# Patient Record
Sex: Female | Born: 1975
Health system: Southern US, Community
[De-identification: ages and names within clinical notes are randomized; demographics above are authoritative.]

## PROBLEM LIST (undated history)

## (undated) DIAGNOSIS — C801 Malignant (primary) neoplasm, unspecified: Secondary | ICD-10-CM

## (undated) DIAGNOSIS — N83209 Unspecified ovarian cyst, unspecified side: Secondary | ICD-10-CM

## (undated) DIAGNOSIS — R102 Pelvic and perineal pain unspecified side: Secondary | ICD-10-CM

## (undated) DIAGNOSIS — R519 Headache, unspecified: Secondary | ICD-10-CM

## (undated) DIAGNOSIS — E119 Type 2 diabetes mellitus without complications: Secondary | ICD-10-CM

## (undated) DIAGNOSIS — N200 Calculus of kidney: Secondary | ICD-10-CM

## (undated) DIAGNOSIS — K660 Peritoneal adhesions (postprocedural) (postinfection): Secondary | ICD-10-CM

## (undated) DIAGNOSIS — I10 Essential (primary) hypertension: Secondary | ICD-10-CM

## (undated) DIAGNOSIS — G4730 Sleep apnea, unspecified: Secondary | ICD-10-CM

## (undated) HISTORY — DX: Sleep apnea, unspecified: G47.30

## (undated) HISTORY — DX: Pelvic and perineal pain: R10.2

## (undated) HISTORY — DX: Calculus of kidney: N20.0

## (undated) HISTORY — DX: Essential (primary) hypertension: I10

## (undated) HISTORY — DX: Pelvic and perineal pain unspecified side: R10.20

## (undated) HISTORY — DX: Peritoneal adhesions (postprocedural) (postinfection): K66.0

---
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
## 1999-08-17 HISTORY — PX: CHOLECYSTECTOMY: SHX55

## 2005-03-16 ENCOUNTER — Ambulatory Visit: Payer: Self-pay

## 2005-03-23 ENCOUNTER — Ambulatory Visit: Payer: Self-pay | Admitting: Unknown Physician Specialty

## 2005-05-05 ENCOUNTER — Observation Stay: Payer: Self-pay

## 2005-05-21 ENCOUNTER — Observation Stay: Payer: Self-pay | Admitting: Obstetrics & Gynecology

## 2005-05-24 ENCOUNTER — Inpatient Hospital Stay: Payer: Self-pay

## 2008-06-24 ENCOUNTER — Ambulatory Visit: Payer: Self-pay | Admitting: Urology

## 2008-11-22 ENCOUNTER — Ambulatory Visit: Payer: Self-pay | Admitting: Urology

## 2008-12-06 ENCOUNTER — Ambulatory Visit: Payer: Self-pay | Admitting: Urology

## 2008-12-20 ENCOUNTER — Ambulatory Visit: Payer: Self-pay | Admitting: Urology

## 2009-02-18 ENCOUNTER — Ambulatory Visit: Payer: Self-pay | Admitting: Urology

## 2009-03-05 ENCOUNTER — Ambulatory Visit: Payer: Self-pay | Admitting: Urology

## 2009-03-18 ENCOUNTER — Ambulatory Visit: Payer: Self-pay | Admitting: Urology

## 2009-10-06 ENCOUNTER — Ambulatory Visit: Payer: Self-pay | Admitting: Urology

## 2009-11-19 IMAGING — CR DG ABDOMEN 1V
1 series · 1 of 1 positions shown · non-contrast
Comparison: none

REASON FOR EXAM: Nephrolithiasis
COMMENTS:

[view not recorded]
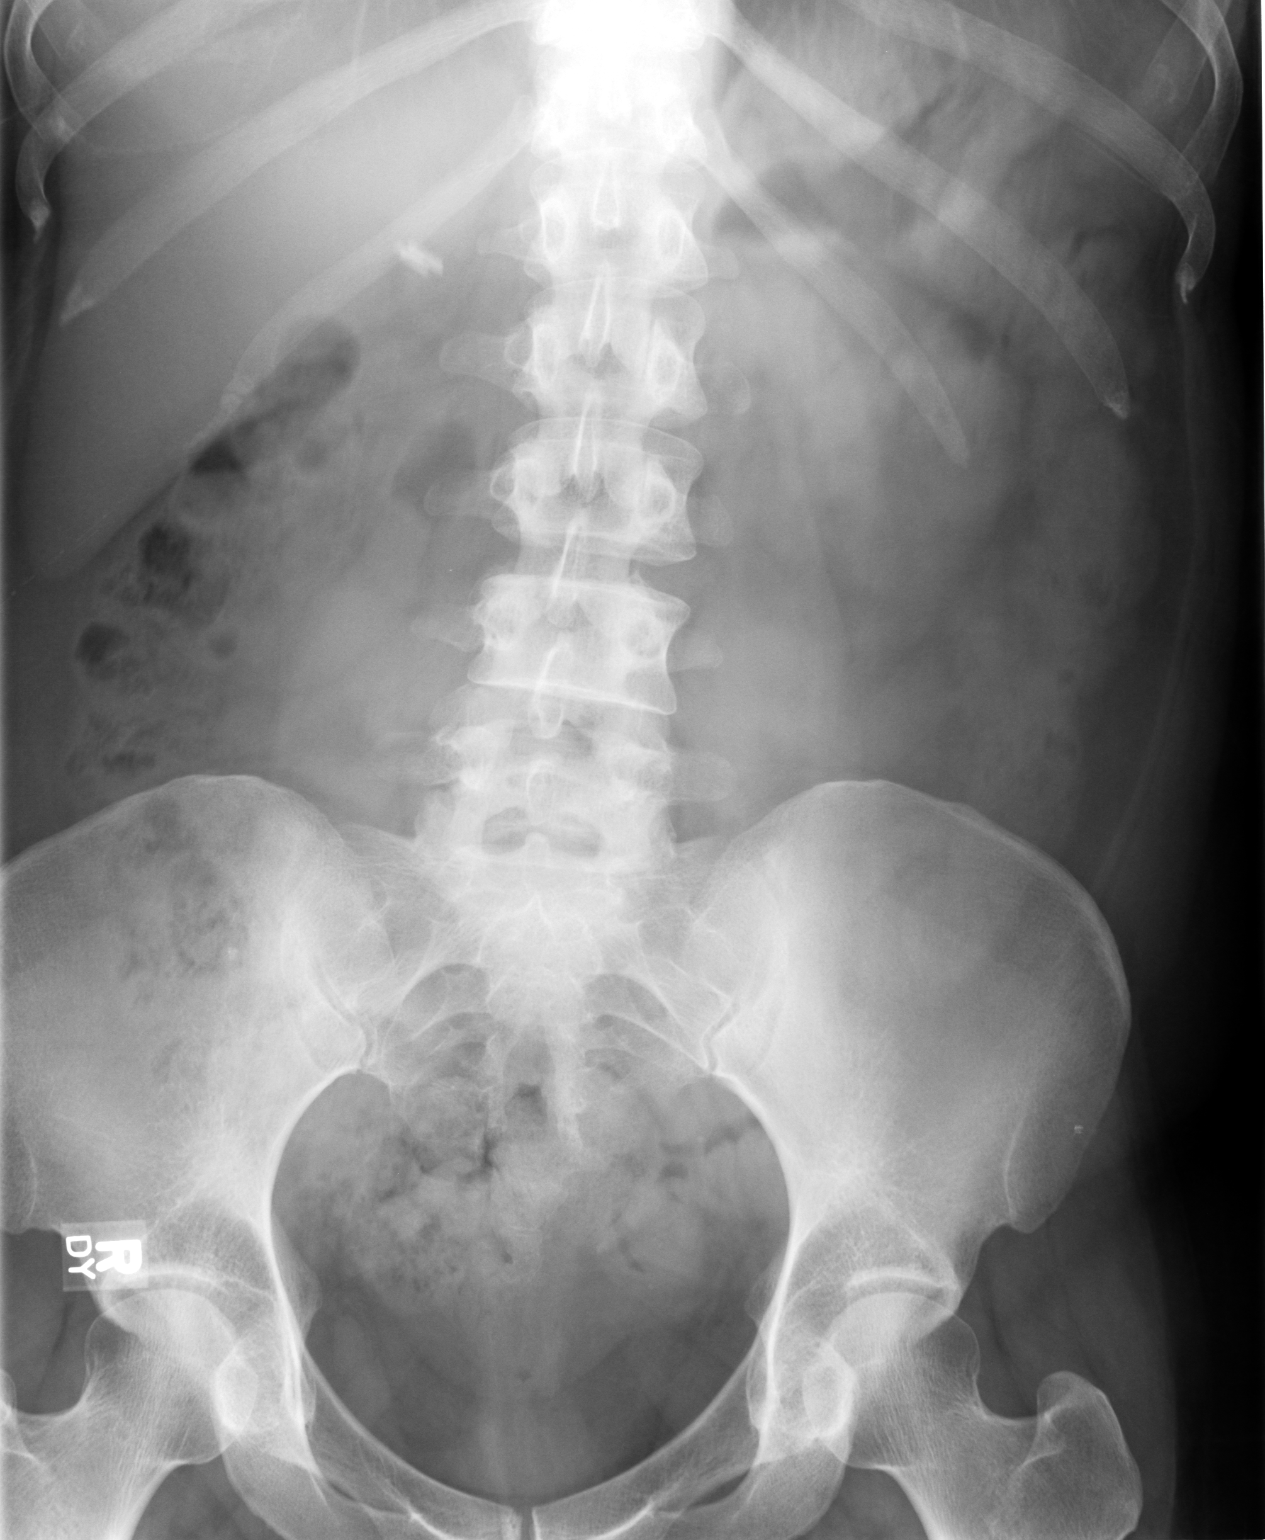

[1 of 1 positions shown; findings below may reference images not displayed]

PROCEDURE:     DXR - DXR KIDNEY URETER BLADDER  - March 18, 2009  [DATE]

RESULT:     Comparison is made to study 18 February, 2009.

The bowel gas pattern is within the limits of normal. I do not see definite
calcifications over either kidney. There are surgical clips in the
gallbladder fossa. No abnormal calcifications are identified along the
expected course of the ureters nor within the urinary bladder. There is a
punctate radiodensity between the right L to 3 and L4 transverse processes
which likely reflects an artifact.
IMPRESSION: I do not see objective evidence of calcified urinary tract
stone. Further interpretation is deferred to Dr. Grami.

## 2010-02-13 ENCOUNTER — Ambulatory Visit: Payer: Self-pay | Admitting: Urology

## 2010-02-22 ENCOUNTER — Emergency Department: Payer: Self-pay | Admitting: Emergency Medicine

## 2010-10-15 ENCOUNTER — Ambulatory Visit: Payer: Self-pay | Admitting: Internal Medicine

## 2010-12-31 ENCOUNTER — Ambulatory Visit: Payer: Self-pay | Admitting: Urology

## 2011-02-22 ENCOUNTER — Ambulatory Visit: Payer: Self-pay | Admitting: Urology

## 2011-02-22 ENCOUNTER — Ambulatory Visit: Payer: Self-pay

## 2011-03-16 ENCOUNTER — Ambulatory Visit: Payer: Self-pay | Admitting: Urology

## 2011-04-20 ENCOUNTER — Ambulatory Visit: Payer: Self-pay | Admitting: Urology

## 2011-05-13 ENCOUNTER — Ambulatory Visit: Payer: Self-pay | Admitting: Urology

## 2011-06-09 ENCOUNTER — Ambulatory Visit: Payer: Self-pay | Admitting: Urology

## 2011-07-07 ENCOUNTER — Ambulatory Visit: Payer: Self-pay | Admitting: Urology

## 2011-07-27 ENCOUNTER — Ambulatory Visit: Payer: Self-pay | Admitting: Urology

## 2011-08-11 ENCOUNTER — Ambulatory Visit: Payer: Self-pay | Admitting: Urology

## 2011-08-18 ENCOUNTER — Ambulatory Visit: Payer: Self-pay | Admitting: Urology

## 2011-08-19 ENCOUNTER — Ambulatory Visit: Payer: Self-pay | Admitting: Urology

## 2011-09-06 ENCOUNTER — Ambulatory Visit: Payer: Self-pay | Admitting: Urology

## 2011-10-19 ENCOUNTER — Ambulatory Visit: Payer: Self-pay | Admitting: Urology

## 2011-12-07 ENCOUNTER — Ambulatory Visit: Payer: Self-pay | Admitting: Urology

## 2011-12-22 IMAGING — CR DG ABDOMEN 1V
1 series · 2 of 2 positions shown · non-contrast
Comparison: none

REASON FOR EXAM: kidney stones - Send films with patient
COMMENTS:

PROCEDURE:     MDR - MDR KIDNEY URETER BLADDER  - April 20, 2011  [DATE]
RESULT:     Comparison: 03/16/2011

[Series 1: view not recorded · 0.17mm/px · 2 of 2 slices shown]
[im 1/2]
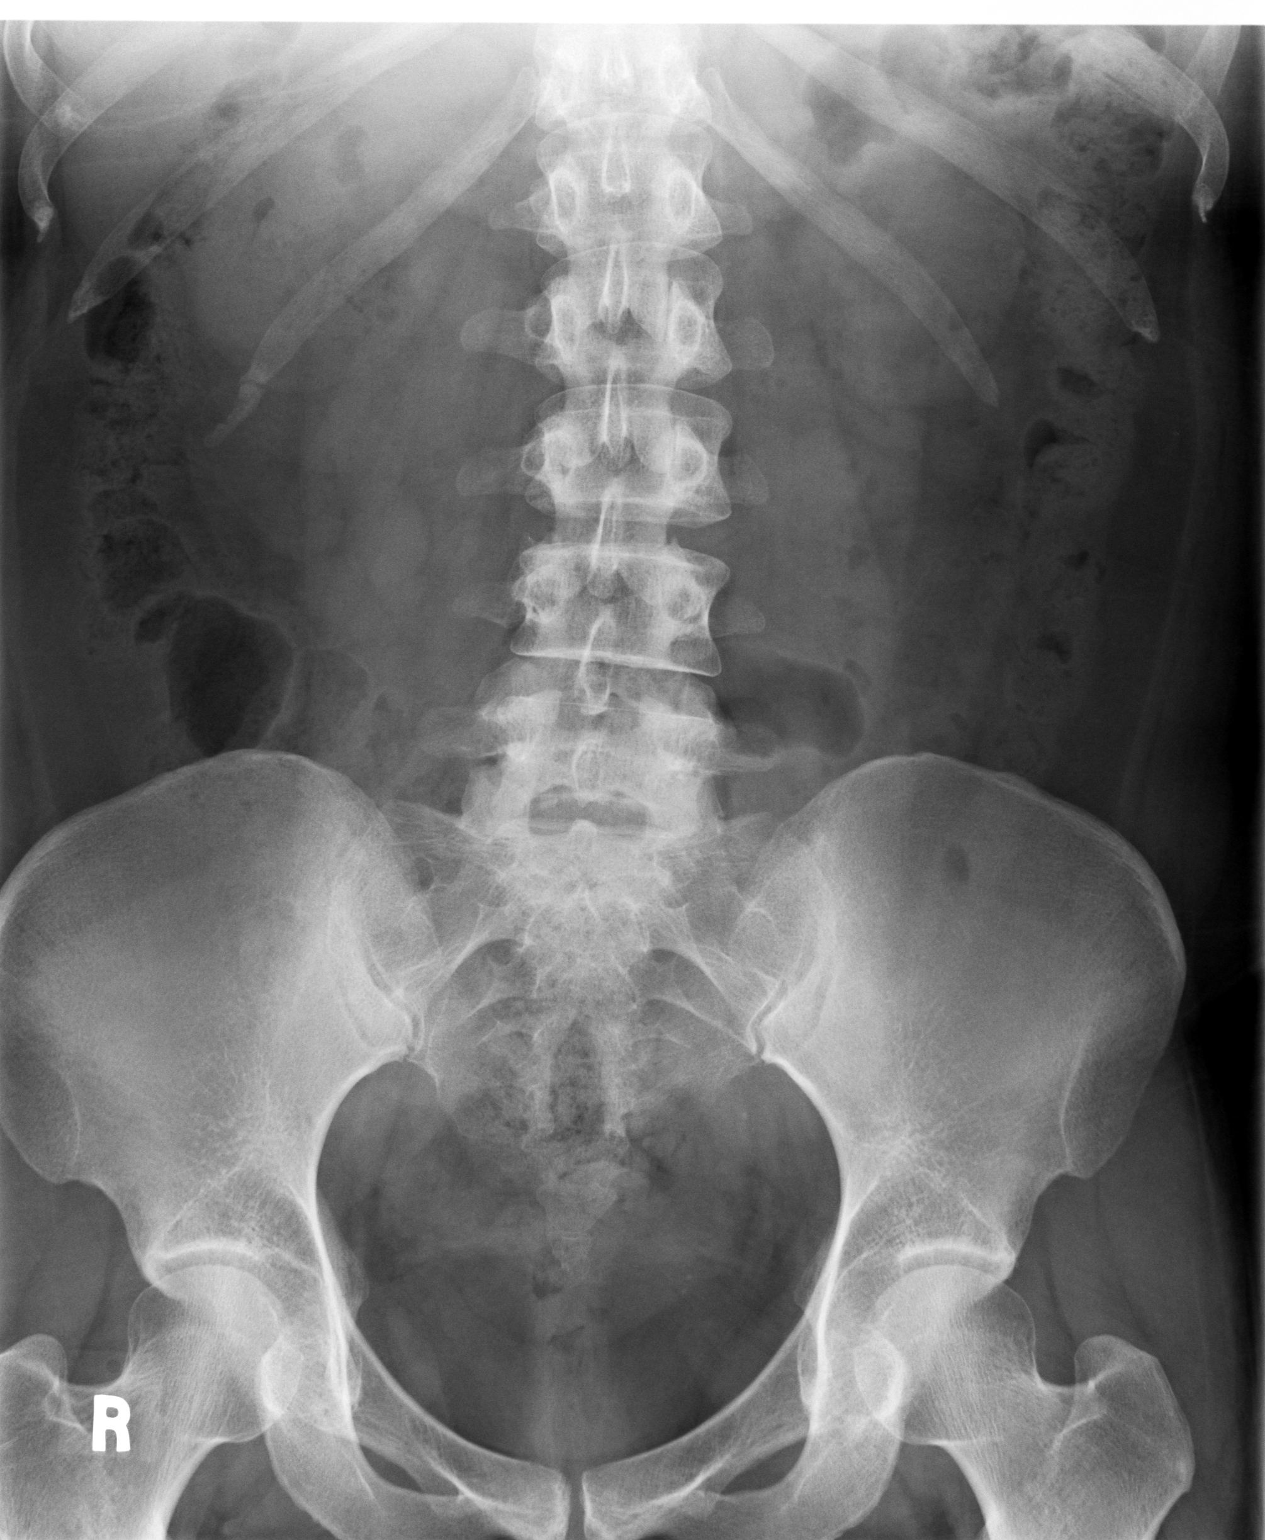
[im 2/2]
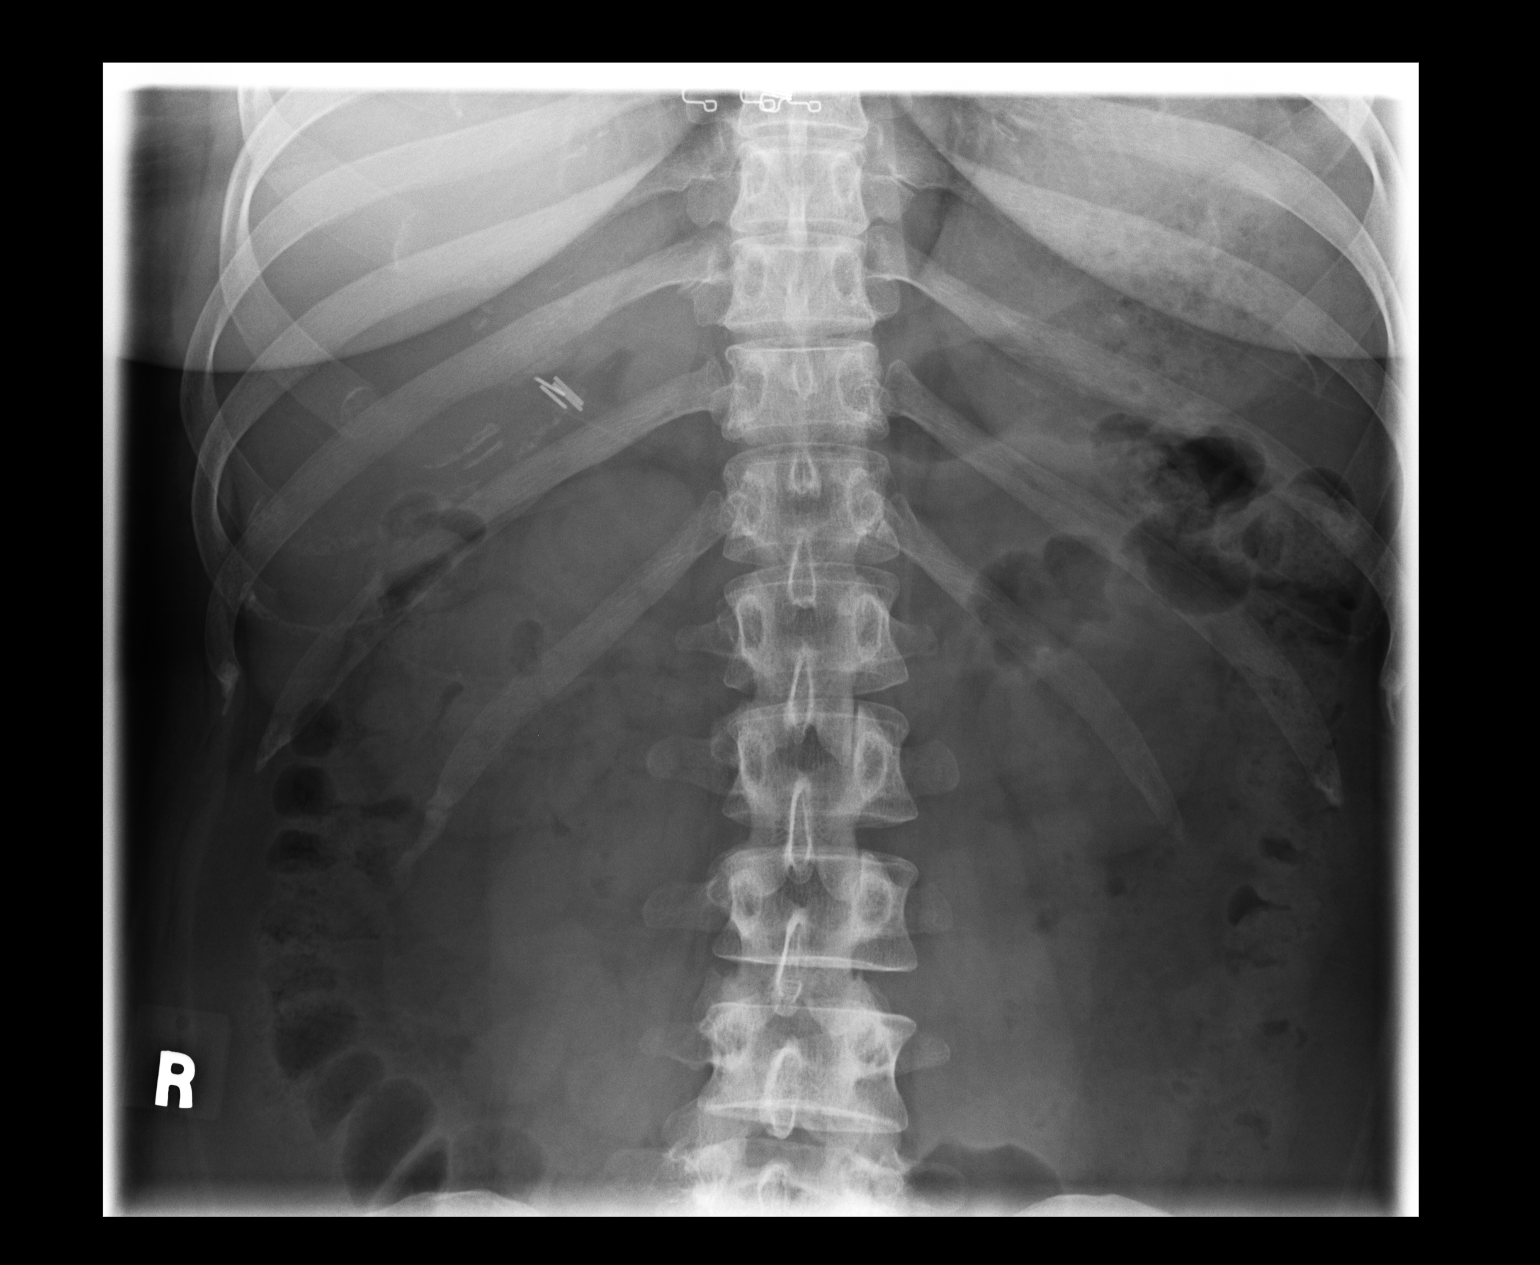

[2 of 2 positions shown; findings below may reference images not displayed]

FINDINGS: A few small radiopaque densities are seen overlying the right renal shadow,
similar to prior. No calculi seen overlying the expected courses of the
ureters or left kidney. Surgical clips seen from prior cholecystectomy.
IMPRESSION: Unchanged right-sided nephrolithiasis.

## 2012-02-10 IMAGING — CR DG ABDOMEN 1V
1 series · 2 of 2 positions shown · non-contrast
Comparison: none

REASON FOR EXAM: kidney stone
COMMENTS:

PROCEDURE:     MDR - MDR KIDNEY URETER BLADDER  - June 09, 2011  [DATE]
RESULT:     Comparison: 05/13/2011

[Series 1: view not recorded · 0.17mm/px · 2 of 2 slices shown]
[im 1/2]
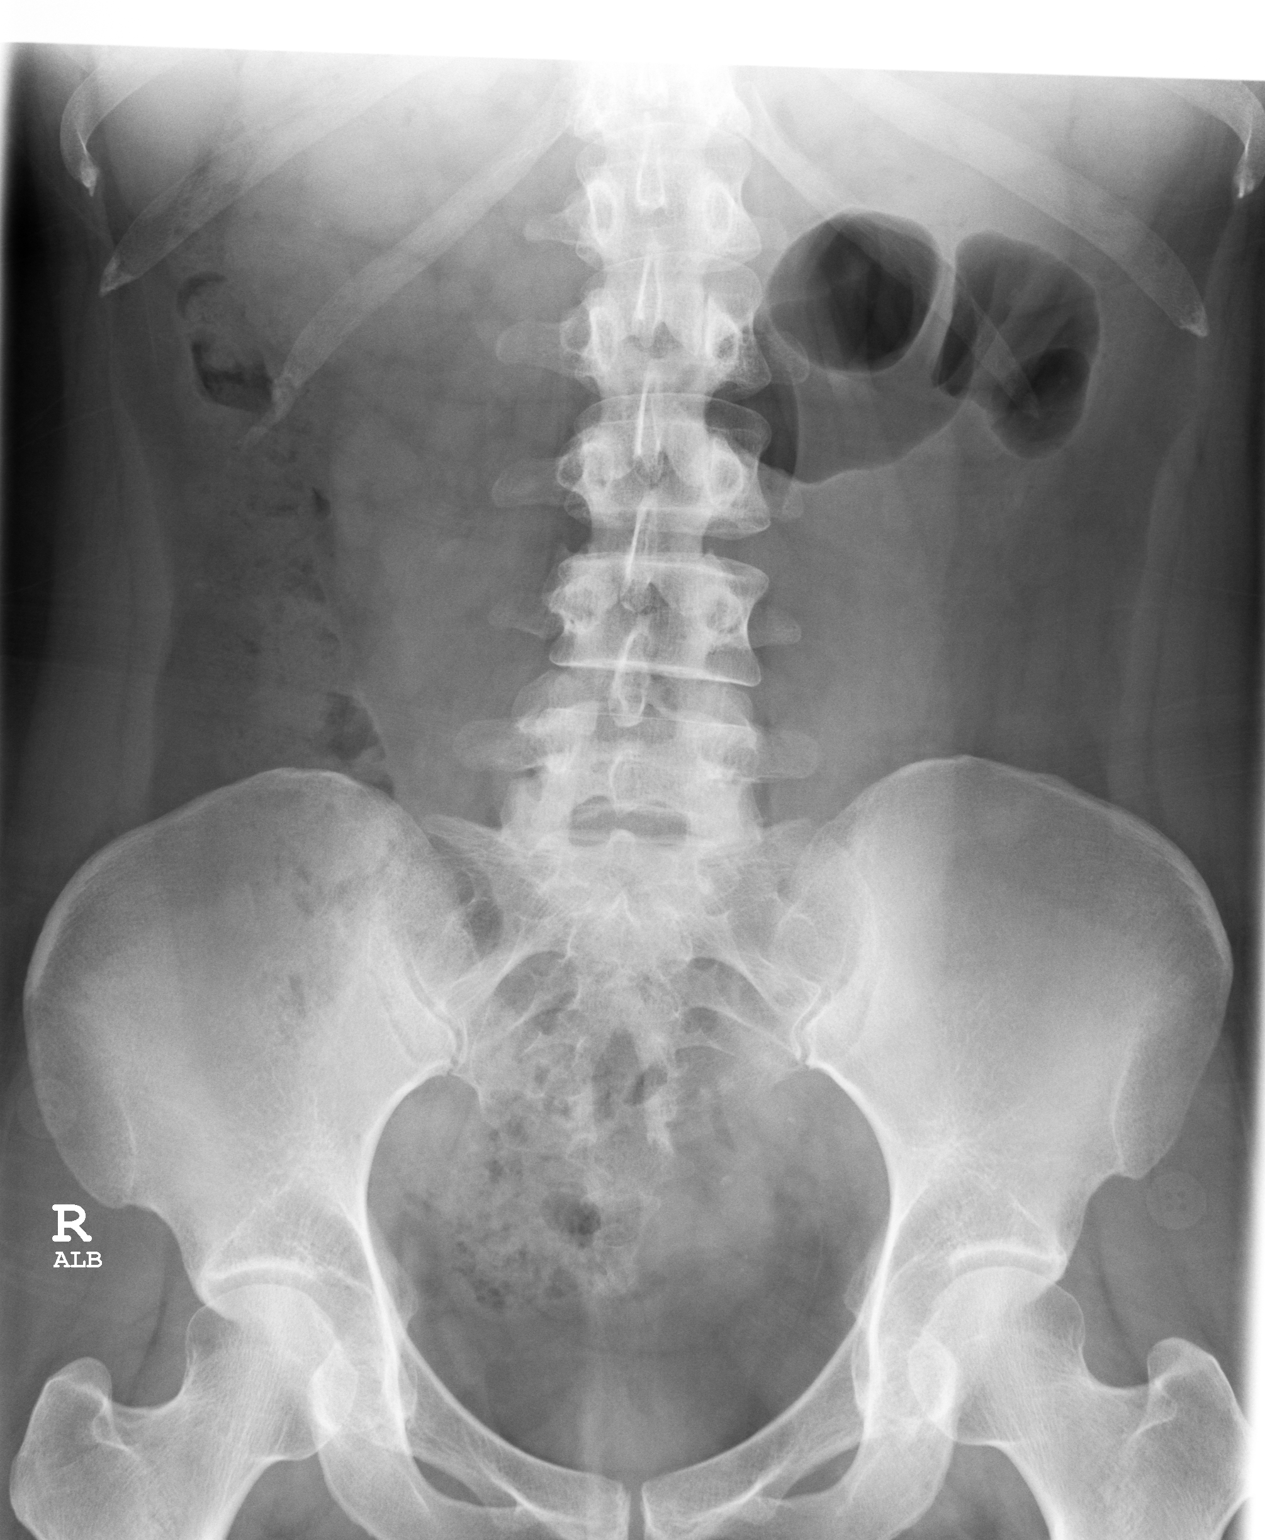
[im 2/2]
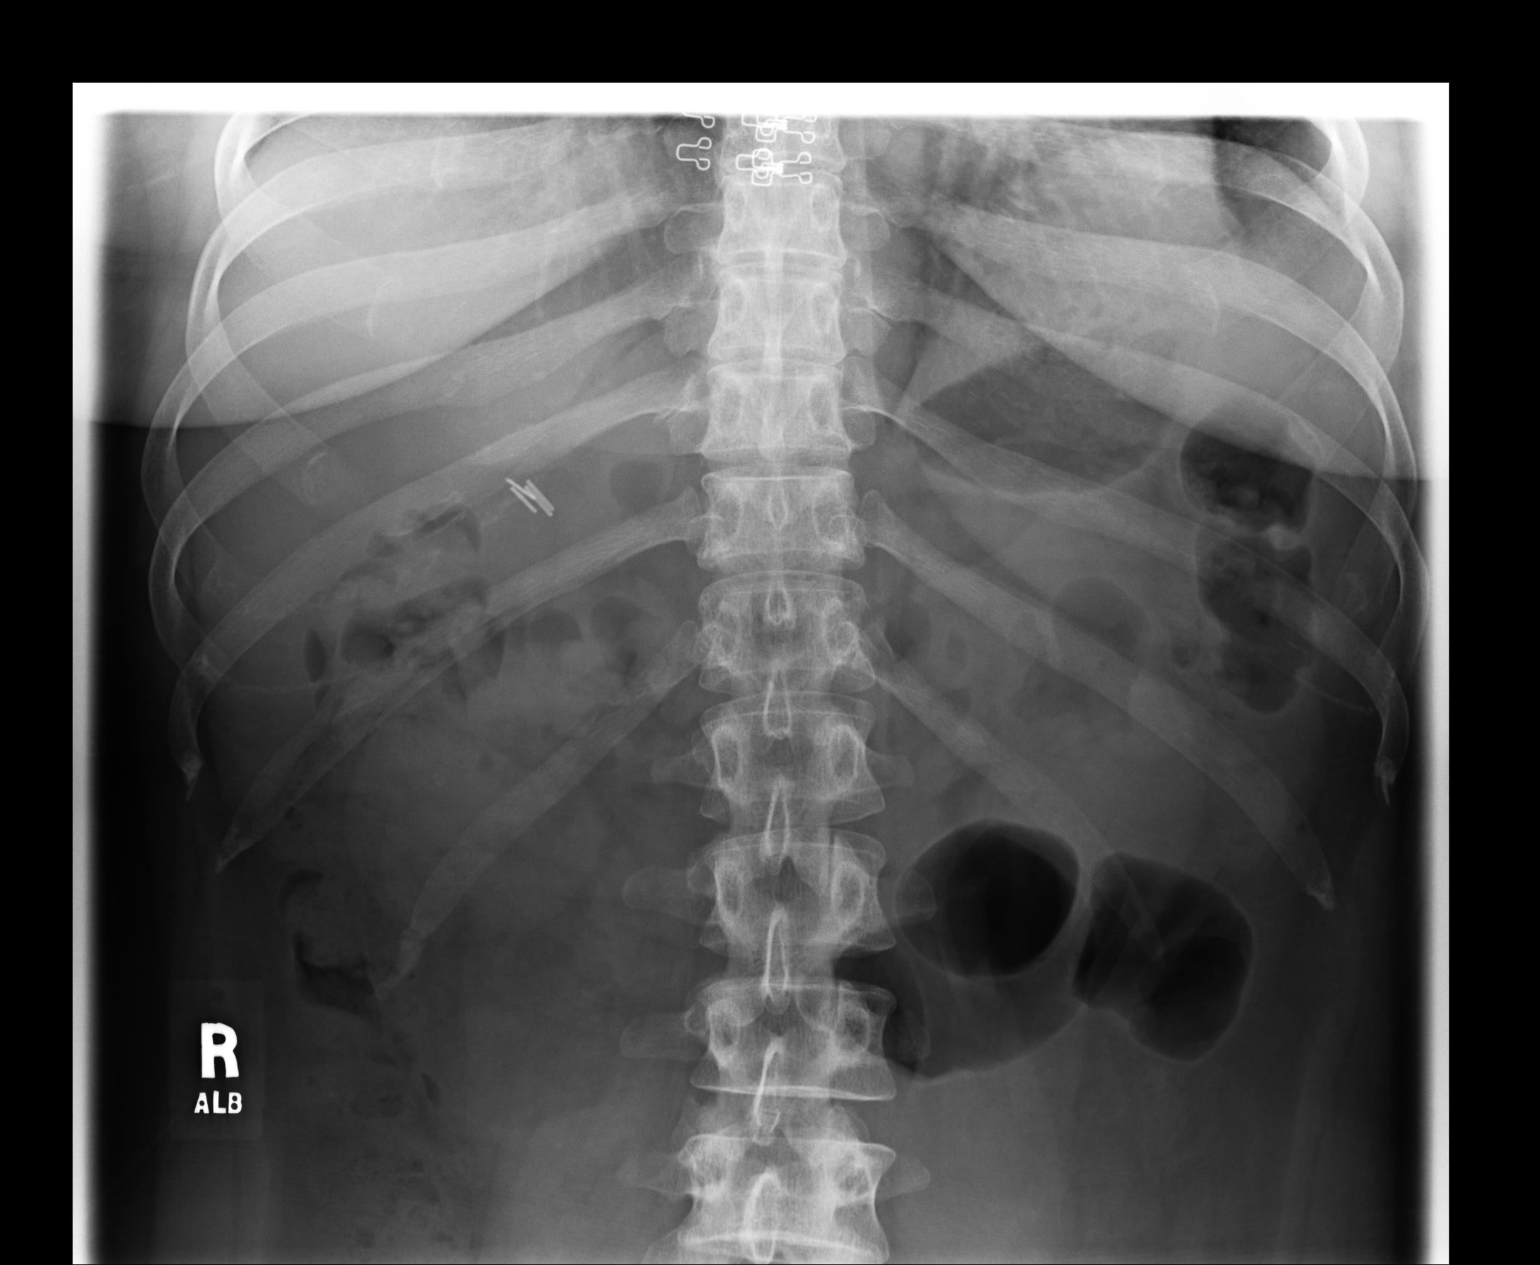

[2 of 2 positions shown; findings below may reference images not displayed]

FINDINGS: Tiny densities overlying the right kidney are similar to prior. No definite
calculi seen overlying the left kidney. No calculi seen overlying the
expected course of the ureters.

There is a relative paucity of small bowel gas. However, no evidence of
dilated loops of small bowel.
IMPRESSION: Unchanged possible right-sided nephrolithiasis.

## 2012-03-09 IMAGING — CR DG ABDOMEN 1V
1 series · 2 of 2 positions shown · non-contrast
Comparison: none

REASON FOR EXAM: calculus of ureter - Send films with patient
COMMENTS:

[Series 1: view not recorded · 0.17mm/px · 2 of 2 slices shown]
[im 1/2]
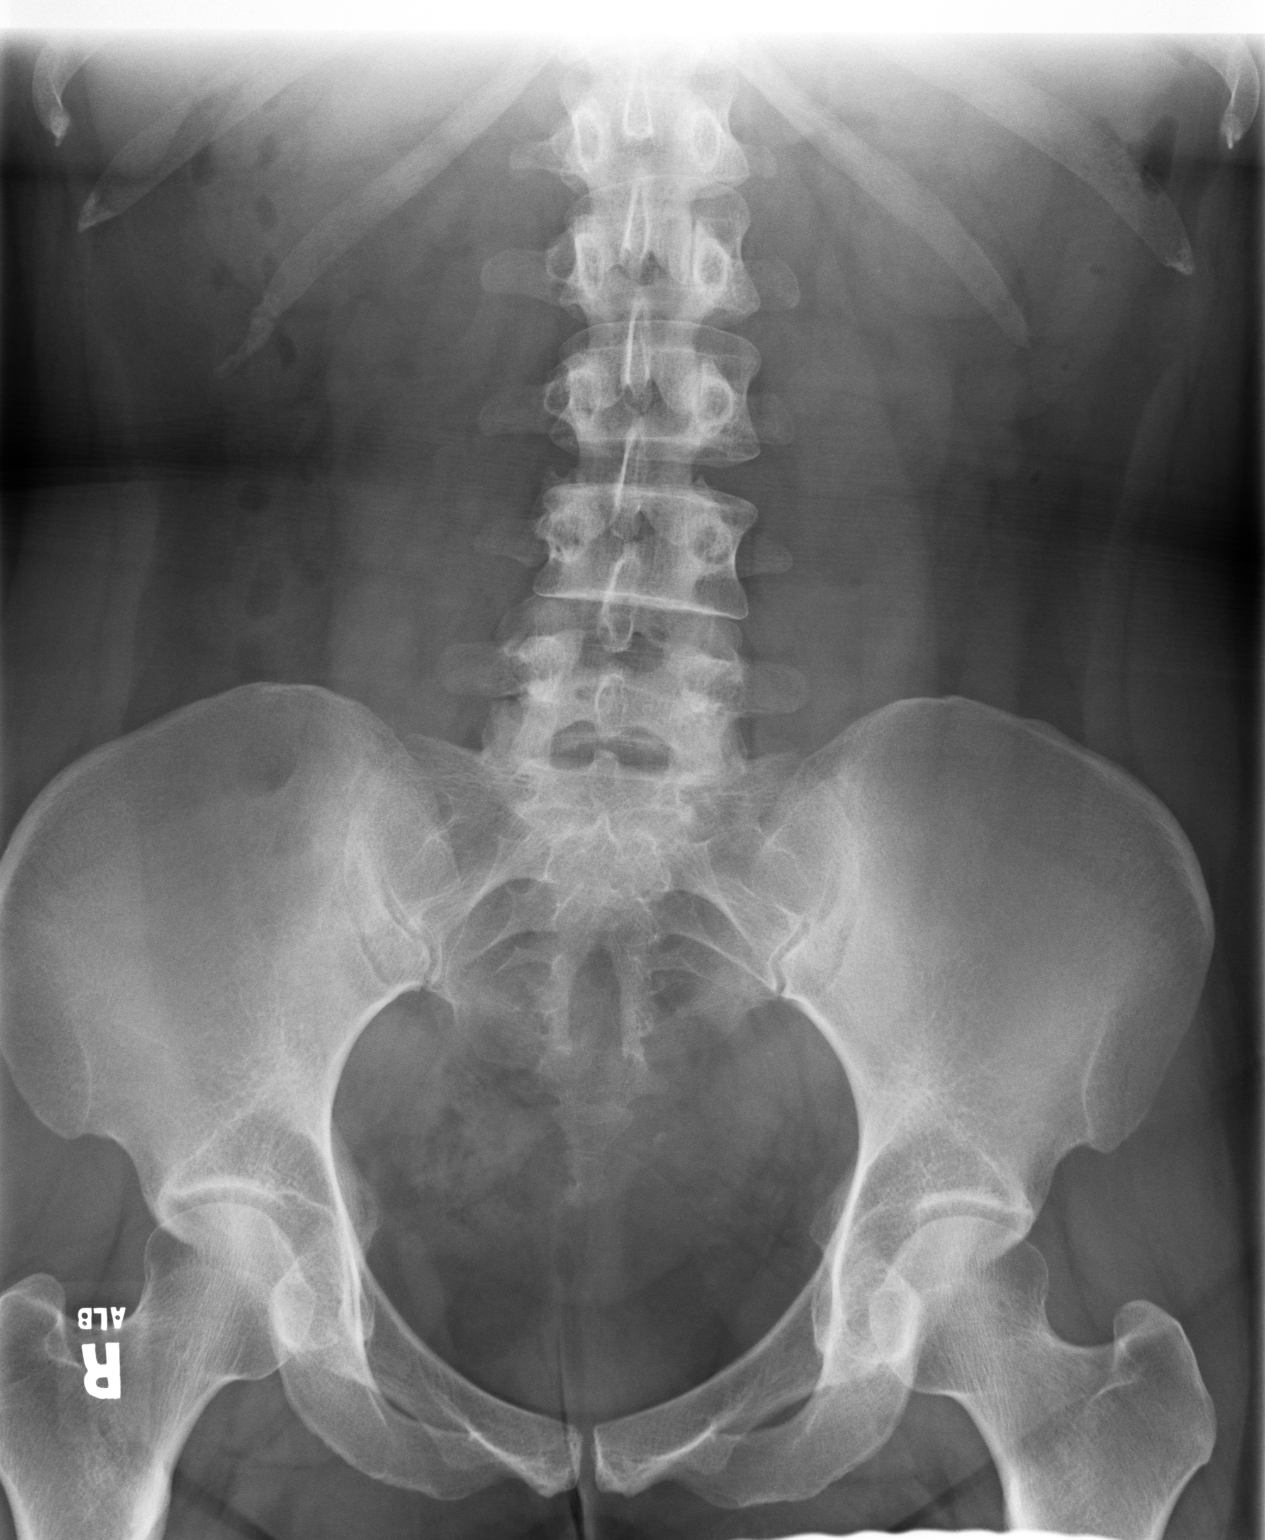
[im 2/2]
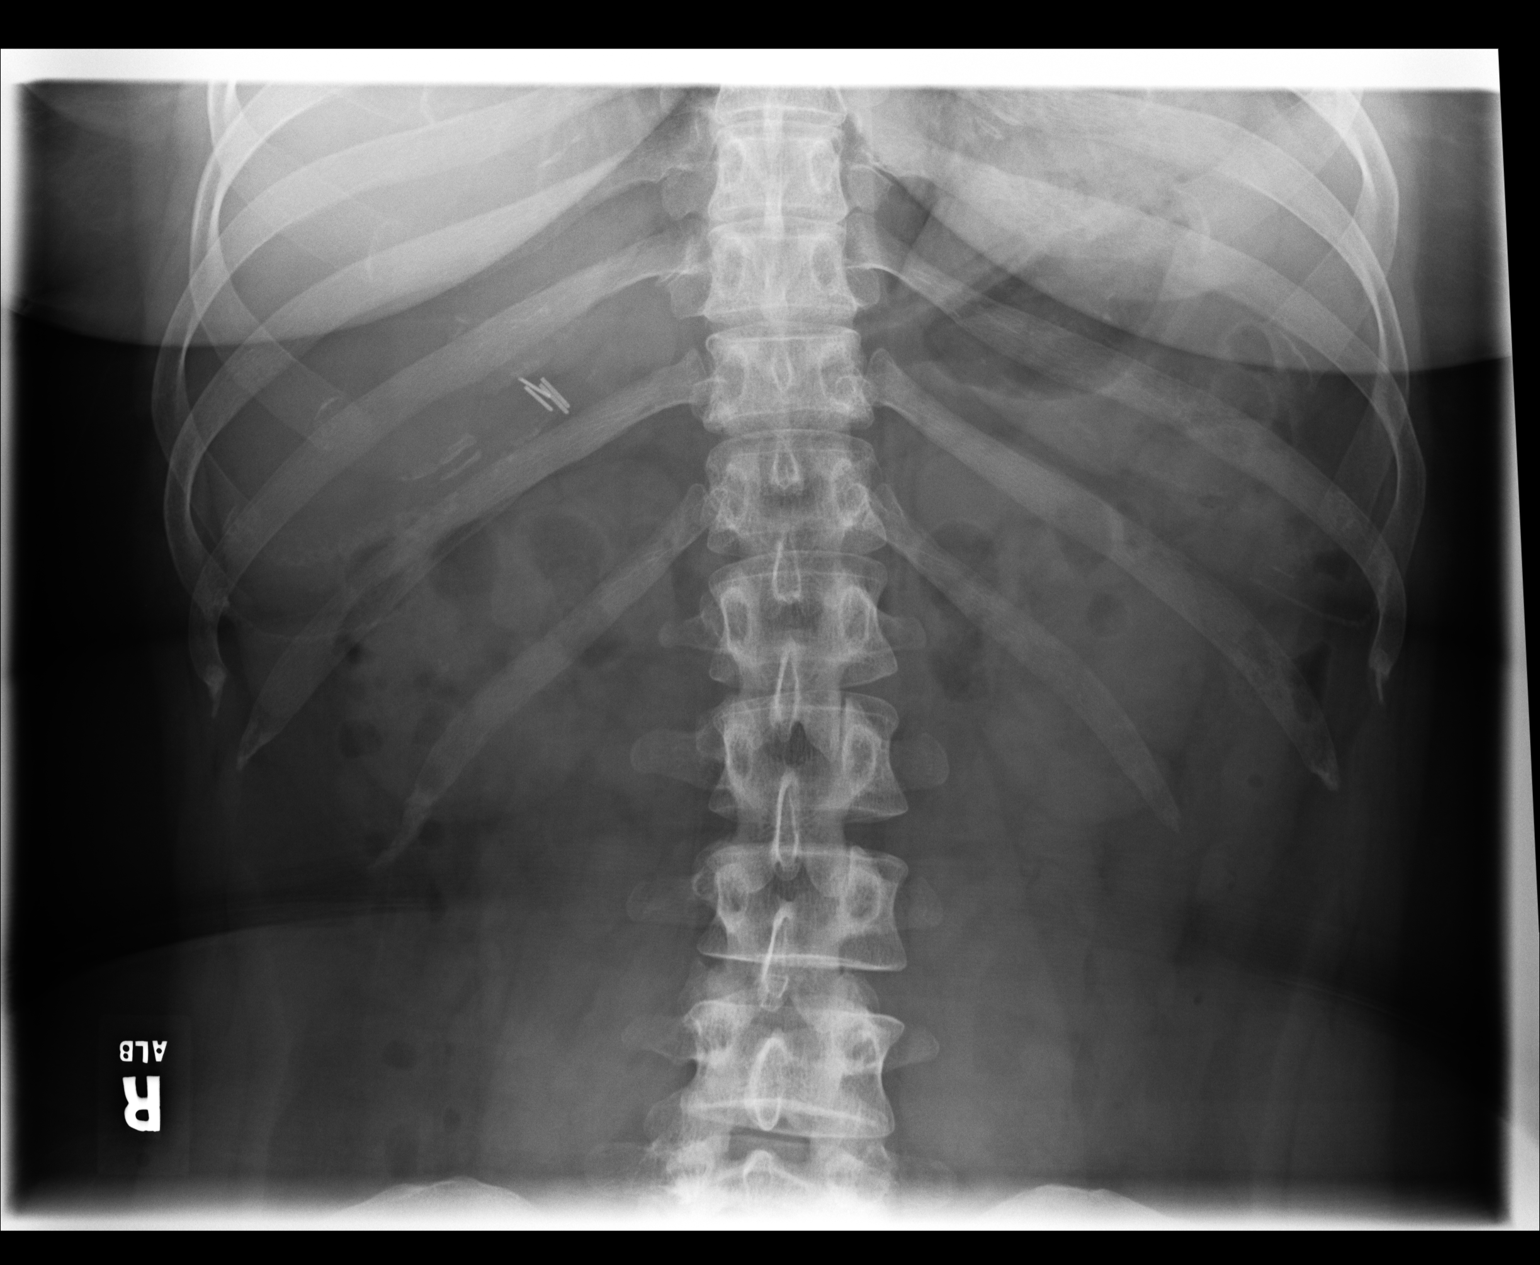

[2 of 2 positions shown; findings below may reference images not displayed]

PROCEDURE:     MDR - MDR KIDNEY URETER BLADDER  - July 07, 2011  [DATE]

RESULT:     Comparison is made to the prior exam of 06/09/2011. There are
noted a few, tiny, sand-like calcifications projected over the mid and lower
pole of the right kidney. No definite left renal stones are seen. No
ureteral calcifications are identified. There is again noted a 3 mm
calcification low in the left pelvis. This has been present on prior
examinations dating back to 5010 and is consistent with a phlebolith.
IMPRESSION: 1.  Right nephrolithiasis.
2.  No definite left nephrolithiasis is identified.
3.  No ureteral stones are identified by routine radiography.

## 2012-04-24 ENCOUNTER — Ambulatory Visit: Payer: Self-pay | Admitting: Urology

## 2012-05-15 ENCOUNTER — Ambulatory Visit: Payer: Self-pay | Admitting: Urology

## 2012-08-16 HISTORY — PX: UTERINE FIBROID SURGERY: SHX826

## 2012-11-02 ENCOUNTER — Ambulatory Visit: Payer: Self-pay | Admitting: Obstetrics and Gynecology

## 2012-11-02 LAB — CBC
MCH: 29.5 pg (ref 26.0–34.0)
MCHC: 34.1 g/dL (ref 32.0–36.0)
MCV: 87 fL (ref 80–100)
RDW: 12.7 % (ref 11.5–14.5)
WBC: 13.6 10*3/uL — ABNORMAL HIGH (ref 3.6–11.0)

## 2012-11-09 ENCOUNTER — Ambulatory Visit: Payer: Self-pay | Admitting: Obstetrics and Gynecology

## 2012-11-11 ENCOUNTER — Ambulatory Visit: Payer: Self-pay | Admitting: Obstetrics and Gynecology

## 2012-11-11 LAB — CBC
HCT: 32.6 % — ABNORMAL LOW (ref 35.0–47.0)
MCV: 86 fL (ref 80–100)
Platelet: 293 10*3/uL (ref 150–440)
WBC: 12.6 10*3/uL — ABNORMAL HIGH (ref 3.6–11.0)

## 2013-01-25 ENCOUNTER — Emergency Department: Payer: Self-pay | Admitting: Emergency Medicine

## 2013-01-25 LAB — BASIC METABOLIC PANEL
Anion Gap: 7 (ref 7–16)
BUN: 9 mg/dL (ref 7–18)
Calcium, Total: 8.6 mg/dL (ref 8.5–10.1)
Chloride: 106 mmol/L (ref 98–107)
Co2: 25 mmol/L (ref 21–32)
Creatinine: 0.85 mg/dL (ref 0.60–1.30)
EGFR (African American): >60
EGFR (Non-African Amer.): >60
Glucose: 124 mg/dL — ABNORMAL HIGH (ref 65–99)
Potassium: 3.7 mmol/L (ref 3.5–5.1)
Sodium: 138 mmol/L (ref 136–145)

## 2013-01-25 LAB — URINALYSIS, COMPLETE
Bacteria: NONE SEEN
Bilirubin,UR: NEGATIVE
Glucose,UR: NEGATIVE mg/dL (ref 0–75)
Leukocyte Esterase: NEGATIVE
Nitrite: NEGATIVE
Ph: 5 (ref 4.5–8.0)
Protein: NEGATIVE
Specific Gravity: 1.013 (ref 1.003–1.030)
WBC UR: 1 /HPF (ref 0–5)

## 2013-01-25 LAB — CBC
HCT: 39.9 % (ref 35.0–47.0)
HGB: 13.8 g/dL (ref 12.0–16.0)
MCH: 28.3 pg (ref 26.0–34.0)
MCHC: 34.6 g/dL (ref 32.0–36.0)
Platelet: 345 10*3/uL (ref 150–440)
RBC: 4.87 10*6/uL (ref 3.80–5.20)

## 2013-03-02 ENCOUNTER — Emergency Department: Payer: Self-pay | Admitting: Emergency Medicine

## 2013-05-01 ENCOUNTER — Ambulatory Visit: Payer: Self-pay | Admitting: Adult Health

## 2013-06-11 ENCOUNTER — Ambulatory Visit (INDEPENDENT_AMBULATORY_CARE_PROVIDER_SITE_OTHER): Payer: 59 | Admitting: Adult Health

## 2013-06-11 ENCOUNTER — Encounter: Payer: Self-pay | Admitting: Adult Health

## 2013-06-11 ENCOUNTER — Encounter (INDEPENDENT_AMBULATORY_CARE_PROVIDER_SITE_OTHER): Payer: Self-pay

## 2013-06-11 VITALS — BP 118/76 | HR 77 | Temp 98.1°F | Resp 12 | Ht 63.5 in | Wt 178.0 lb

## 2013-06-11 DIAGNOSIS — Z0000 Encounter for general adult medical examination without abnormal findings: Secondary | ICD-10-CM | POA: Insufficient documentation

## 2013-06-11 NOTE — Assessment & Plan Note (Addendum)
Normal physical exam. Last Pap was normal in 2013. Request medical records. Flu vaccine is obtained through her employer. They have not done this yet. Check CBC with differential, comprehensive metabolic panel, hemoglobin A1c, lipids and TSH.  Patient will have this done through [employer].

## 2013-06-11 NOTE — Progress Notes (Signed)
Subjective:    Patient ID: Tracy Mason, female    DOB: 02/05/1976, 37 y.o.   MRN: [id number]  HPI  Patient is a pleasant 37 year old female who presents to clinic to establish care. She reports being followed by Dr. Dareen Piano at [facility]. It has been several years since she last saw him. She is also followed at [facility]. Patient is requesting to transfer everything to this office. Will request medical records. Patient reports that she had wellness labs drawn through her employer, [employer], approximately 3 months ago. She was concerned about the results of her cholesterol, blood pressure and hemoglobin A1c given her family history of heart disease and diabetes. She began diet and exercise. She is working towards running a 5K. Patient reports that she has lost 8 pounds since beginning to diet and exercise. She is requesting a repeat of her labs to evaluate her progress.     Past Medical History  Diagnosis Date  . Nephrolithiasis     Approximately 20-25 episodes     Past Surgical History  Procedure Laterality Date  . Cholecystectomy  2001  . Breast surgery Right 1993    benign biopsy  . Appendectomy  1988  . Uterine fibroid surgery  2014    benign     Family History  Problem Relation Age of Onset  . Diabetes Mother   . Heart disease Father   . Diabetes Maternal Grandmother      History   Social History  . Marital Status: Married    Spouse Name: Tawanna Cooler    Number of Children: N/A  . Years of Education: 16   Occupational History  . [employer]   Social History Main Topics  . Smoking status: Former Smoker -- 10 years    Quit date: 05/16/2004  . Smokeless tobacco: Never Used  . Alcohol Use: No  . Drug Use: No  . Sexual Activity: Yes    Birth Control/ Protection: Implant   Other Topics Concern  . Not on file   Social History Narrative   Dericka grew up in [place], [place]. She attended [employer] and obtained her Bachelor's in [place]. She currently  works in the IT sales professional for [employer]. She lives at home with her husband, Tawanna Cooler, and their daughter, Alycia Rossetti, age 30. She is very active in her church. She also is very active in volunteering at her daughter's school. She enjoys shopping, reading and going to the beach. They have two dogs, Jax and Cash.      Review of Systems  Constitutional: Negative.   HENT: Negative.   Eyes: Negative.   Respiratory: Negative.   Cardiovascular: Negative.   Gastrointestinal: Negative.   Endocrine: Negative.   Genitourinary: Negative.   Musculoskeletal: Negative.   Skin: Negative.   Allergic/Immunologic: Negative.   Neurological: Negative.   Hematological: Negative.   Psychiatric/Behavioral: Negative.        Objective:   Physical Exam  Constitutional: She is oriented to person, place, and time. She appears well-developed and well-nourished. No distress.  HENT:  Head: Normocephalic and atraumatic.  Right Ear: External ear normal.  Left Ear: External ear normal.  Nose: Nose normal.  Mouth/Throat: Oropharynx is clear and moist.  Eyes: Conjunctivae and EOM are normal. Pupils are equal, round, and reactive to light.  Neck: Normal range of motion. Neck supple. No tracheal deviation present. No thyromegaly present.  Cardiovascular: Normal rate, regular rhythm, normal heart sounds and intact distal pulses.  Exam reveals no gallop and  no friction rub.   No murmur heard. Pulmonary/Chest: Effort normal and breath sounds normal. No respiratory distress. She has no wheezes. She has no rales.  Abdominal: Soft. Bowel sounds are normal. She exhibits no distension and no mass. There is no tenderness. There is no rebound and no guarding.  Musculoskeletal: Normal range of motion. She exhibits no edema and no tenderness.  Lymphadenopathy:    She has no cervical adenopathy.  Neurological: She is alert and oriented to person, place, and time. She has normal reflexes. No cranial nerve deficit. Coordination  normal.  Skin: Skin is warm and dry.  Psychiatric: She has a normal mood and affect. Her behavior is normal. Judgment and thought content normal.          Assessment & Plan:

## 2013-06-11 NOTE — Patient Instructions (Signed)
  Thank you for choosing [facility] for your health care needs.  Please have your labs drawn at your earliest convenience.  The results will be available through MyChart for your convenience. Please remember to activate this. The activation code is located at the end of this form. 

## 2013-06-19 ENCOUNTER — Telehealth: Payer: Self-pay | Admitting: Adult Health

## 2013-06-19 NOTE — Telephone Encounter (Signed)
Calling for results.  States she had them drawn at [facility] 10/28.  Please respond through mychart.

## 2013-06-19 NOTE — Telephone Encounter (Signed)
Do you have her results? I thought I saw them come in this week

## 2013-06-19 NOTE — Telephone Encounter (Signed)
Sent message regarding labs

## 2013-06-20 ENCOUNTER — Encounter: Payer: Self-pay | Admitting: Adult Health

## 2013-06-20 NOTE — Telephone Encounter (Signed)
Sent her the message on MyChart

## 2013-06-21 ENCOUNTER — Other Ambulatory Visit: Payer: Self-pay

## 2013-07-06 ENCOUNTER — Encounter: Payer: Self-pay | Admitting: Adult Health

## 2013-08-28 ENCOUNTER — Emergency Department: Payer: Self-pay | Admitting: Emergency Medicine

## 2013-08-28 LAB — COMPREHENSIVE METABOLIC PANEL
ALBUMIN: 3.8 g/dL (ref 3.4–5.0)
ALK PHOS: 92 U/L
Anion Gap: 5 — ABNORMAL LOW (ref 7–16)
BUN: 10 mg/dL (ref 7–18)
Bilirubin,Total: 0.3 mg/dL (ref 0.2–1.0)
CALCIUM: 9.3 mg/dL (ref 8.5–10.1)
CREATININE: 0.86 mg/dL (ref 0.60–1.30)
Chloride: 103 mmol/L (ref 98–107)
Co2: 25 mmol/L (ref 21–32)
Glucose: 112 mg/dL — ABNORMAL HIGH (ref 65–99)
OSMOLALITY: 266 (ref 275–301)
Potassium: 3.6 mmol/L (ref 3.5–5.1)
SGOT(AST): 26 U/L (ref 15–37)
SGPT (ALT): 27 U/L (ref 12–78)
Sodium: 133 mmol/L — ABNORMAL LOW (ref 136–145)
Total Protein: 8 g/dL (ref 6.4–8.2)

## 2013-08-28 LAB — CBC
HCT: 44.2 % (ref 35.0–47.0)
HGB: 14.8 g/dL (ref 12.0–16.0)
MCH: 28.7 pg (ref 26.0–34.0)
MCHC: 33.4 g/dL (ref 32.0–36.0)
MCV: 86 fL (ref 80–100)
PLATELETS: 282 10*3/uL (ref 150–440)
RBC: 5.16 10*6/uL (ref 3.80–5.20)
RDW: 12.9 % (ref 11.5–14.5)
WBC: 14.3 10*3/uL — ABNORMAL HIGH (ref 3.6–11.0)

## 2013-08-28 LAB — URINALYSIS, COMPLETE
BILIRUBIN, UR: NEGATIVE
GLUCOSE, UR: NEGATIVE mg/dL (ref 0–75)
KETONE: NEGATIVE
NITRITE: NEGATIVE
Ph: 5 (ref 4.5–8.0)
Protein: NEGATIVE
RBC,UR: 5 /HPF (ref 0–5)
Specific Gravity: 1.016 (ref 1.003–1.030)
Squamous Epithelial: 5
WBC UR: 8 /HPF (ref 0–5)

## 2013-09-28 IMAGING — CR DG ABDOMEN 1V
1 series · 2 of 2 positions shown · non-contrast
Comparison: none

REASON FOR EXAM: left flank pain; hx kidney stones
COMMENTS:

PROCEDURE:     DXR - DXR KIDNEY URETER BLADDER  - January 25, 2013  [DATE]
RESULT:     Comparison: 04/24/2012, CT of the abdomen and pelvis 05/15/2012

[Series 1: supine kub · 0.17mm/px · 2 of 2 slices shown]
[im 1/2]
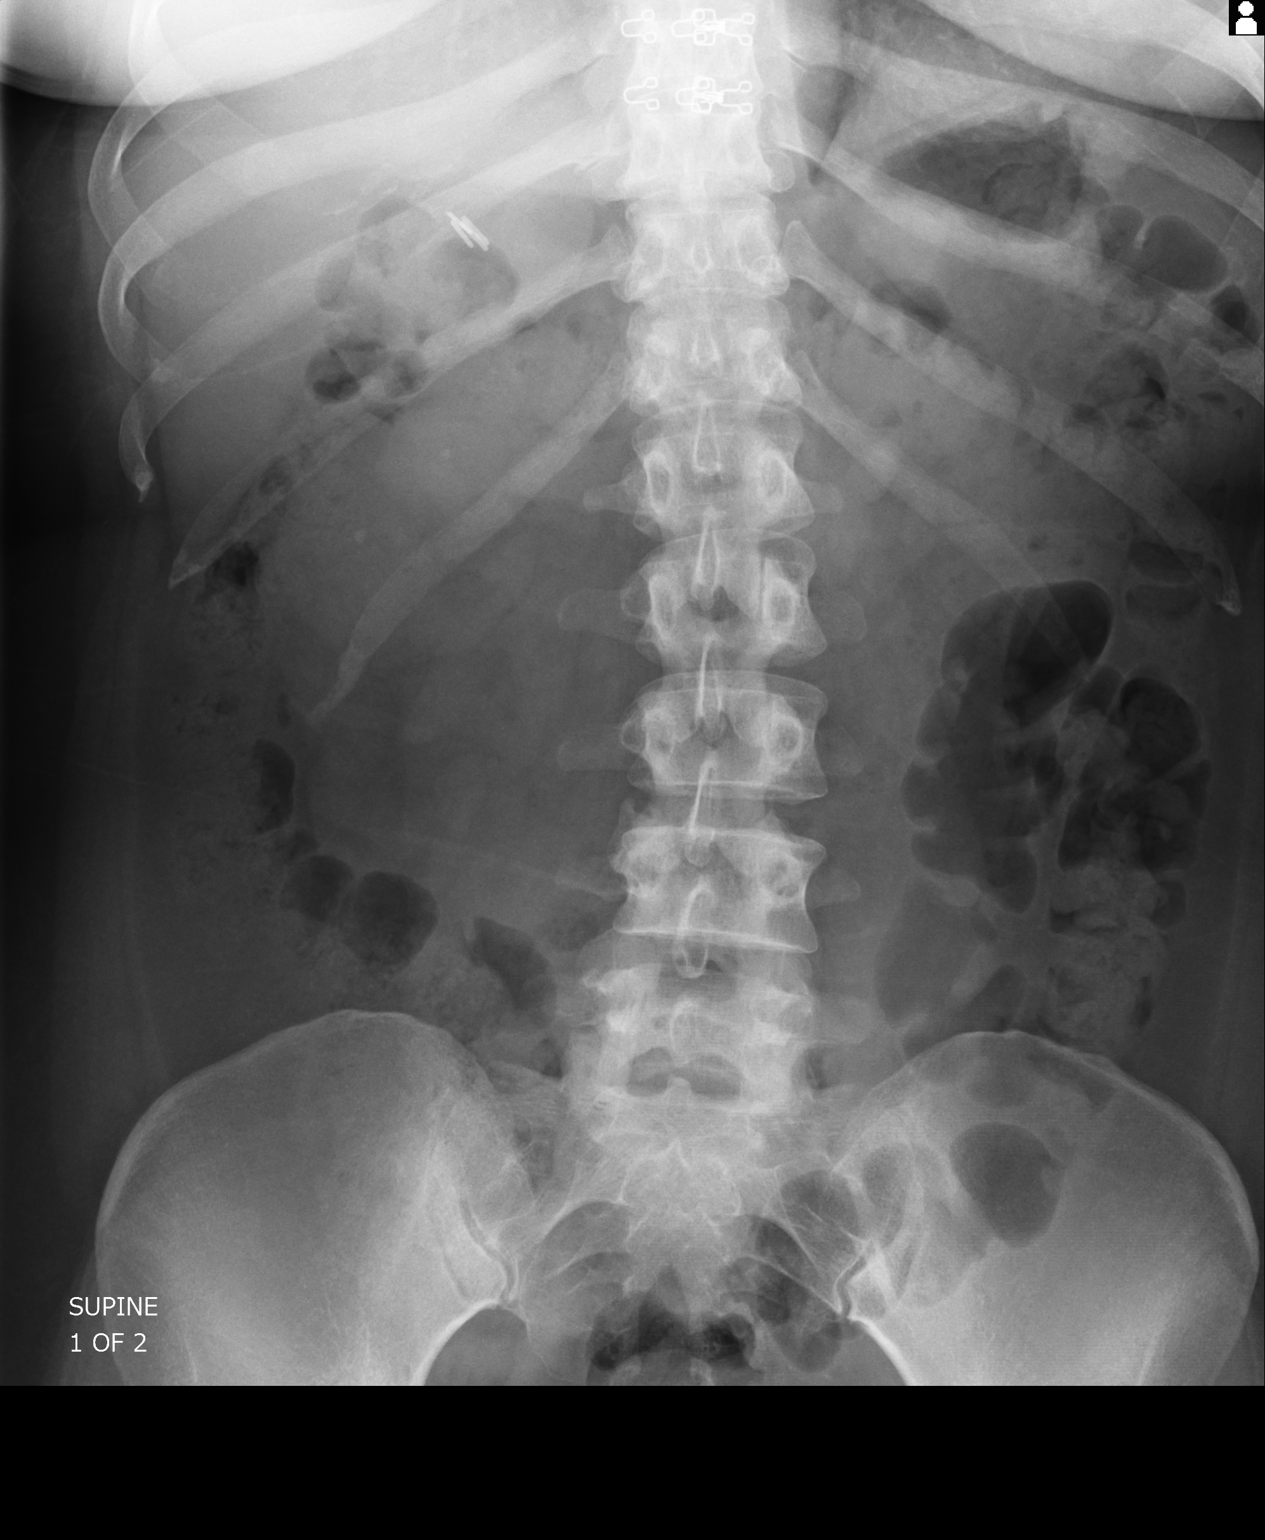
[im 2/2]
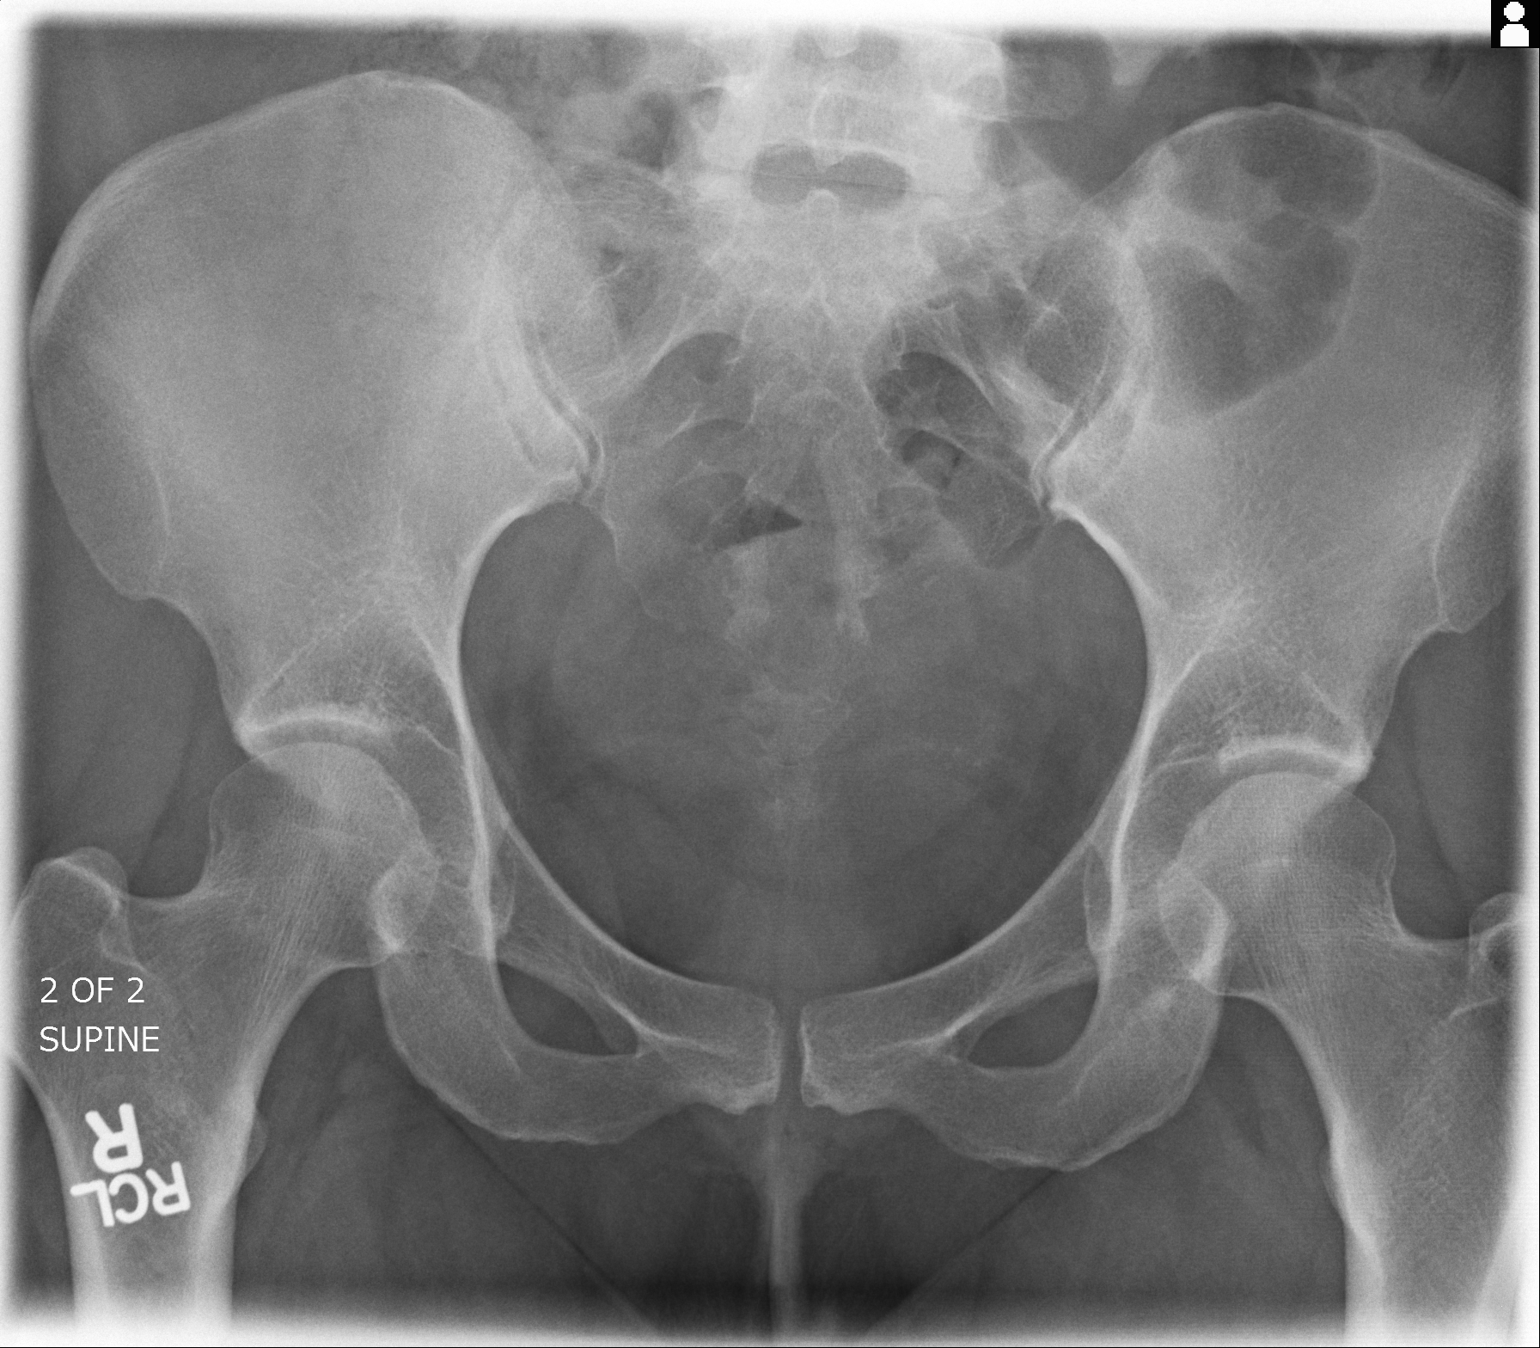

[2 of 2 positions shown; findings below may reference images not displayed]

FINDINGS: Multiple calculi overlying the right kidney. Tiny calcific density overlying
the pelvis likely represent phleboliths. Air seen within nondilated colon.
There is slight levocurvature of the lumbar spine. Round density just left
lateral of the L1 vertebral bodies likely to overlying ingested material.
Prior cholecystectomy.
IMPRESSION: Right-sided nephrolithiasis.

[REDACTED]

## 2014-02-04 ENCOUNTER — Emergency Department: Payer: Self-pay | Admitting: Emergency Medicine

## 2014-02-04 LAB — URINALYSIS, COMPLETE
BILIRUBIN, UR: NEGATIVE
Bacteria: NONE SEEN
Glucose,UR: NEGATIVE mg/dL (ref 0–75)
Hyaline Cast: 2
KETONE: NEGATIVE
Leukocyte Esterase: NEGATIVE
Nitrite: NEGATIVE
PH: 5 (ref 4.5–8.0)
Protein: NEGATIVE
RBC,UR: 36 /HPF (ref 0–5)
Specific Gravity: 1.024 (ref 1.003–1.030)

## 2014-02-04 LAB — COMPREHENSIVE METABOLIC PANEL
ALK PHOS: 71 U/L
ANION GAP: 5 — AB (ref 7–16)
AST: 54 U/L — AB (ref 15–37)
Albumin: 4 g/dL (ref 3.4–5.0)
BUN: 16 mg/dL (ref 7–18)
Bilirubin,Total: 0.3 mg/dL (ref 0.2–1.0)
CALCIUM: 9.1 mg/dL (ref 8.5–10.1)
CHLORIDE: 106 mmol/L (ref 98–107)
CREATININE: 0.85 mg/dL (ref 0.60–1.30)
Co2: 26 mmol/L (ref 21–32)
EGFR (African American): >60
EGFR (Non-African Amer.): >60
Glucose: 104 mg/dL — ABNORMAL HIGH (ref 65–99)
Osmolality: 275 (ref 275–301)
Potassium: 3.9 mmol/L (ref 3.5–5.1)
SGPT (ALT): 65 U/L (ref 12–78)
Sodium: 137 mmol/L (ref 136–145)
Total Protein: 7.6 g/dL (ref 6.4–8.2)

## 2014-02-04 LAB — CBC
HCT: 42.1 % (ref 35.0–47.0)
HGB: 14.1 g/dL (ref 12.0–16.0)
MCH: 29.7 pg (ref 26.0–34.0)
MCHC: 33.4 g/dL (ref 32.0–36.0)
MCV: 89 fL (ref 80–100)
PLATELETS: 255 10*3/uL (ref 150–440)
RBC: 4.74 10*6/uL (ref 3.80–5.20)
RDW: 12.7 % (ref 11.5–14.5)
WBC: 11.7 10*3/uL — AB (ref 3.6–11.0)

## 2014-02-27 DIAGNOSIS — N23 Unspecified renal colic: Secondary | ICD-10-CM | POA: Insufficient documentation

## 2014-02-27 DIAGNOSIS — Z841 Family history of disorders of kidney and ureter: Secondary | ICD-10-CM | POA: Insufficient documentation

## 2014-09-05 LAB — HM PAP SMEAR: HM PAP: NEGATIVE

## 2014-09-09 ENCOUNTER — Emergency Department: Payer: Self-pay | Admitting: Emergency Medicine

## 2014-09-09 LAB — COMPREHENSIVE METABOLIC PANEL
Albumin: 3.9 g/dL (ref 3.4–5.0)
Alkaline Phosphatase: 73 U/L
Anion Gap: 5 — ABNORMAL LOW (ref 7–16)
BILIRUBIN TOTAL: 0.2 mg/dL (ref 0.2–1.0)
BUN: 12 mg/dL (ref 7–18)
CHLORIDE: 107 mmol/L (ref 98–107)
CO2: 26 mmol/L (ref 21–32)
Calcium, Total: 9 mg/dL (ref 8.5–10.1)
Creatinine: 0.94 mg/dL (ref 0.60–1.30)
EGFR (Non-African Amer.): >60
GLUCOSE: 78 mg/dL (ref 65–99)
Osmolality: 274 (ref 275–301)
POTASSIUM: 3.4 mmol/L — AB (ref 3.5–5.1)
SGOT(AST): 28 U/L (ref 15–37)
SGPT (ALT): 46 U/L
SODIUM: 138 mmol/L (ref 136–145)
TOTAL PROTEIN: 7.3 g/dL (ref 6.4–8.2)

## 2014-09-09 LAB — URINALYSIS, COMPLETE
BACTERIA: NONE SEEN
BILIRUBIN, UR: NEGATIVE
Blood: NEGATIVE
GLUCOSE, UR: NEGATIVE mg/dL (ref 0–75)
KETONE: NEGATIVE
Leukocyte Esterase: NEGATIVE
Nitrite: NEGATIVE
Ph: 5 (ref 4.5–8.0)
Protein: NEGATIVE
Specific Gravity: 1.026 (ref 1.003–1.030)
WBC UR: <1 /HPF (ref 0–5)

## 2014-09-09 LAB — CBC
HCT: 41.5 % (ref 35.0–47.0)
HGB: 13.5 g/dL (ref 12.0–16.0)
MCH: 29.3 pg (ref 26.0–34.0)
MCHC: 32.6 g/dL (ref 32.0–36.0)
MCV: 90 fL (ref 80–100)
Platelet: 297 10*3/uL (ref 150–440)
RBC: 4.62 10*6/uL (ref 3.80–5.20)
RDW: 12.9 % (ref 11.5–14.5)
WBC: 13.5 10*3/uL — ABNORMAL HIGH (ref 3.6–11.0)

## 2014-09-09 LAB — PREGNANCY, URINE: Pregnancy Test, Urine: NEGATIVE m[IU]/mL

## 2014-09-09 LAB — LIPASE, BLOOD: Lipase: 177 U/L (ref 73–393)

## 2014-12-06 NOTE — Op Note (Signed)
PATIENT NAME:  Tracy Mason, Tracy Mason MR#:  [id number] DATE OF BIRTH:  1976/05/02  DATE OF PROCEDURE:  11/09/2012  PREOPERATIVE DIAGNOSES:  Prolapsing endometrial polyp or fibroid.   POSTOPERATIVE DIAGNOSES:  Prolapsing endometrial polyp.   OPERATION PERFORMED:  Operative hysteroscopy with polypectomy using the MyoSure  device.   ANESTHESIA USED:  General.   SURGEON:  Stoney Bang. Georgianne Fick, MD.   ESTIMATED BLOOD LOSS:  Minimal.   OPERATIVE FLUIDS:  250 mL.   COMPLICATIONS:  None.   INTRAOPERATIVE FINDINGS:  A prolapsing 2.5 x 3 cm polyp on a stalk arising from the right posterior uterine wall.   SPECIMENS REMOVED:  Endometrial polyps.   CONDITION FOLLOWING THE PROCEDURE:  Stable.   PROCEDURE IN DETAIL:  The risks, benefits, and alternatives of the procedure were discussed with the patient prior to proceeding to the Operating Room. The patient was taken to the Operating Room where she was placed under general anesthesia with LMA airway. She was positioned in the dorsal lithotomy position using Allen stirrups, prepped and draped in the usual sterile fashion. A time-out was performed. The patient's bladder was emptied using a straight catheter. A sterile speculum was then placed into the patient's vagina. The cervix was visualized. The anterior lip of the cervix was grasped with a single-tooth tenaculum. The polyp was grasped using polyp forceps and it was able to be delivered using a twisting motion. A hysteroscopy was then performed. The remainder of the uterine cavity and cervix appeared grossly normal. The residual portion of the endometrial polyp stalk was resected using the MyoSure device. Following this, the hysteroscope was removed as was the single-tooth tenaculum. The tenaculum site was noted to be hemostatic. The speculum was removed. The sponge, needle, and instrument counts were correct x 2. The patient tolerated the procedure well and was taken to the Recovery Room in stable condition.    ____________________________ Stoney Bang. Georgianne Fick, MD ams:jm D: 11/11/2012 11:26:34 ET T: 11/11/2012 12:03:56 ET JOB#: [id number]  cc: Stoney Bang. Georgianne Fick, MD, <Dictator> Dorthula Nettles MD ELECTRONICALLY SIGNED 11/14/2012 13:18

## 2014-12-06 NOTE — Op Note (Signed)
PATIENT NAME:  Tracy Mason, Tracy Mason MR#:  [id number] DATE OF BIRTH:  08-Jul-1976  DATE OF PROCEDURE:  11/11/2012  PREOPERATIVE DIAGNOSES: Postoperative uterine hemorrhage.  POSTOPERATIVE DIAGNOSIS: Postoperative uterine hemorrhage.   OPERATION PERFORMED: Operative hysteroscopy with cauterization of previously resected endometrial polyp stump.   ANESTHESIA USED: General.   PRIMARY SURGEON: Stoney Bang. Georgianne Fick, MD.   ESTIMATED BLOOD LOSS: Minimal.   COMPLICATIONS: None.   INTRAOPERATIVE FINDINGS: No active bleeding was noted at the time of hysteroscopy. There was some clot that had formed over the previously resected polyp stalk. That clot was removed, and the area in question was cauterized using a roller barrel. There was no active bleeding noted from the area after prolonged visualization during the hysteroscopy following cauterization.   SPECIMENS REMOVED: None.   PATIENT CONDITION FOLLOWING PROCEDURE: Stable.   PROCEDURE IN DETAIL: Risks, benefits and alternatives of the procedure were discussed with the patient prior to proceeding to the Operating Room. The patient had undergone a resection of a large prolapsing endometrial polyp approximately 2 days prior to her presentation to the Emergency Room. At presentation, she was filling a pad about every 30 minutes. Her hemoglobin had dropped approximately 1 g since her preoperative hemoglobin. It was discussed with the patient that likely bleeding was secondary to the feeder vessel into the polyp having begun to hemorrhage. We discussed management options, including conservative management via placement of a Foley catheter into the uterus to allow tamponade of the bleeding. The patient opted to proceed with surgical management. She was taken to the Operating Room, where she was placed under general endotracheal anesthesia. She was positioned in the dorsal lithotomy position using Allen stirrups and prepped and draped in the usual sterile fashion. A  timeout procedure was then performed. A sterile speculum was placed. The anterior lip of the cervix was visualized and grasped with a single-tooth tenaculum. The cervix was serially dilated to allow passage of the operative hysteroscope. Upon placing the hysteroscope into the endometrial cavity, above noted findings were noted. A roller ball was used to cauterize the previously resected endometrial polyp stalk. Following this, the area in question was observed with no further bleeding. The hysteroscope was removed. There was still no bleeding noted from the cervix. The single-tooth tenaculum was removed, as was the sterile speculum. Sponge, needle and instrument counts were correct x 2. The patient tolerated the procedure well and was taken to the recovery room in stable condition.    ____________________________ Stoney Bang. Georgianne Fick, MD ams:lg D: 11/14/2012 13:23:21 ET T: 11/14/2012 14:08:06 ET JOB#: [id number]  cc: Stoney Bang. Georgianne Fick, MD, <Dictator> Conan Bowens Madelon Lips MD ELECTRONICALLY SIGNED 11/29/2012 15:28

## 2014-12-24 NOTE — H&P (Signed)
L&D Evaluation:  History:  HPI 39 yo female postoperative day 2 s/p operative hysteroscopy for resection of prolapsing polyp.  The patient had no major bleeding on postoperative day 1.  She called this morning and noted some increasing vaginal bleeding about 1 pad an hour.  The bleeding then increased to one pad every 30 minutes which prompted her presentation.  She has also felt somewhat dizzy.  No palpiations, CP, SOB.  She has had some mild cramping but otherwise feels well.   Presents with vaginal bleeding   Patient's Medical History Nephrolithiasis   Patient's Surgical History Recent hysterosocpy, D&C,  appendectomy, cholecystectomy appendectomy, cholecystectomy   Medications Motrin (Ibuprofen)  Perocet 5/325   Allergies other, Biaxin   Social History none   Family History Non-Contributory   ROS:  ROS See HPI   Exam:  Vital Signs BP >140/90   Urine Protein not completed   General no apparent distress   Mental Status clear   Chest no increased work of breathing   Heart normal sinus rhythm   Abdomen gravid, non-tender   Edema no edema   Pelvic no external lesions, cervix closed and thick, Evacuated one 3x3cm clot, 3 scopets of blood in vaginal vault.  The cervix is observed with no active bleeding noted from the cervical os   Impression:  Impression 39 yo POD#2 s/p operative hysteroscopy with resection of endometrial polyp   Plan:  Comments - Hgb/HCT fairly stable 12.1 & 35.6 preoperatively and 11.2 and 32.6 today.  Discussed that with heavy bleeding will take some time to equilibrate and may not be evident.  She is hemodynamically stable as far as vital signs are concerned. - This may represent normal postoperative bleediing, or it could represent bleeding from the stalk of the resected polyp.  Will repeat speculum exam in 30 minutes to see how much blood patient accumulates in that time frame.  If will proceed to OR for hysteroscopy and cauterization of any  bleeders that might be found.  If limited or no further bleeding noted will plan on discharging home.   Electronic Signatures: Dorthula Nettles (MD)  (Signed 29-Mar-14 17:23)  Authored: L&D Evaluation   Last Updated: 29-Mar-14 17:23 by Dorthula Nettles (MD)

## 2015-03-25 DIAGNOSIS — N393 Stress incontinence (female) (male): Secondary | ICD-10-CM | POA: Insufficient documentation

## 2015-04-14 ENCOUNTER — Encounter: Payer: Self-pay | Admitting: Emergency Medicine

## 2015-04-14 ENCOUNTER — Emergency Department
Admission: EM | Admit: 2015-04-14 | Discharge: 2015-04-14 | Disposition: A | Discharge status: Home / Self Care | Payer: 59 | Attending: Emergency Medicine | Admitting: Emergency Medicine

## 2015-04-14 ENCOUNTER — Emergency Department: Payer: 59

## 2015-04-14 DIAGNOSIS — R109 Unspecified abdominal pain: Secondary | ICD-10-CM

## 2015-04-14 DIAGNOSIS — N201 Calculus of ureter: Secondary | ICD-10-CM | POA: Insufficient documentation

## 2015-04-14 DIAGNOSIS — Z87891 Personal history of nicotine dependence: Secondary | ICD-10-CM | POA: Diagnosis not present

## 2015-04-14 DIAGNOSIS — R1031 Right lower quadrant pain: Secondary | ICD-10-CM | POA: Diagnosis present

## 2015-04-14 DIAGNOSIS — Z3202 Encounter for pregnancy test, result negative: Secondary | ICD-10-CM | POA: Diagnosis not present

## 2015-04-14 HISTORY — DX: Calculus of kidney: N20.0

## 2015-04-14 HISTORY — DX: Unspecified ovarian cyst, unspecified side: N83.209

## 2015-04-14 LAB — URINALYSIS COMPLETE WITH MICROSCOPIC (ARMC ONLY)
BACTERIA UA: NONE SEEN
BILIRUBIN URINE: NEGATIVE
Glucose, UA: NEGATIVE mg/dL
Ketones, ur: NEGATIVE mg/dL
LEUKOCYTES UA: NEGATIVE
Nitrite: NEGATIVE
PH: 5 (ref 5.0–8.0)
PROTEIN: NEGATIVE mg/dL
Specific Gravity, Urine: 1.028 (ref 1.005–1.030)

## 2015-04-14 LAB — COMPREHENSIVE METABOLIC PANEL
ALT: 15 U/L (ref 14–54)
AST: 27 U/L (ref 15–41)
Albumin: 3.9 g/dL (ref 3.5–5.0)
Alkaline Phosphatase: 55 U/L (ref 38–126)
Anion gap: 10 (ref 5–15)
BUN: 13 mg/dL (ref 6–20)
CHLORIDE: 107 mmol/L (ref 101–111)
CO2: 21 mmol/L — AB (ref 22–32)
CREATININE: 0.98 mg/dL (ref 0.44–1.00)
Calcium: 9.1 mg/dL (ref 8.9–10.3)
GFR calc non Af Amer: >60 mL/min (ref >60)
Glucose, Bld: 108 mg/dL — ABNORMAL HIGH (ref 65–99)
POTASSIUM: 3.7 mmol/L (ref 3.5–5.1)
SODIUM: 138 mmol/L (ref 135–145)
Total Bilirubin: 0.5 mg/dL (ref 0.3–1.2)
Total Protein: 7.2 g/dL (ref 6.5–8.1)

## 2015-04-14 LAB — CBC
HEMATOCRIT: 39.3 % (ref 35.0–47.0)
Hemoglobin: 13.2 g/dL (ref 12.0–16.0)
MCH: 29.5 pg (ref 26.0–34.0)
MCHC: 33.7 g/dL (ref 32.0–36.0)
MCV: 87.5 fL (ref 80.0–100.0)
PLATELETS: 289 10*3/uL (ref 150–440)
RBC: 4.49 MIL/uL (ref 3.80–5.20)
RDW: 12.8 % (ref 11.5–14.5)
WBC: 12.3 10*3/uL — AB (ref 3.6–11.0)

## 2015-04-14 LAB — POCT PREGNANCY, URINE: PREG TEST UR: NEGATIVE

## 2015-04-14 MED ORDER — TAMSULOSIN HCL 0.4 MG PO CAPS: 0.8000 mg | ORAL_CAPSULE | Freq: Once | ORAL | Status: DC

## 2015-04-14 MED ORDER — OXYCODONE-ACETAMINOPHEN 5-325 MG PO TABS: 1.0000 | ORAL_TABLET | Freq: Four times a day (QID) | ORAL | Status: DC | PRN

## 2015-04-14 MED ORDER — FENTANYL CITRATE (PF) 100 MCG/2ML IJ SOLN
50.0000 ug | Freq: Once | INTRAMUSCULAR | Status: AC
  Administered 2015-04-14: 50 ug via INTRAVENOUS
  Filled 2015-04-14: qty 2

## 2015-04-14 MED ORDER — SODIUM CHLORIDE 0.9 % IV BOLUS (SEPSIS)
1000.0000 mL | Freq: Once | INTRAVENOUS | Status: AC
  Administered 2015-04-14: 1000 mL via INTRAVENOUS

## 2015-04-14 MED ORDER — ONDANSETRON HCL 4 MG/2ML IJ SOLN
4.0000 mg | Freq: Once | INTRAMUSCULAR | Status: AC | PRN
  Administered 2015-04-14: 4 mg via INTRAVENOUS
  Filled 2015-04-14: qty 2

## 2015-04-14 MED ORDER — KETOROLAC TROMETHAMINE 30 MG/ML IJ SOLN
30.0000 mg | Freq: Once | INTRAMUSCULAR | Status: AC
Start: 2015-04-14 — End: 2015-04-14
  Administered 2015-04-14: 30 mg via INTRAVENOUS
  Filled 2015-04-14: qty 1

## 2015-04-14 NOTE — ED Notes (Signed)
C/o right flank pain that radiates to RLQ area with nausea and urinary frequency, hx of kidney stones

## 2015-04-14 NOTE — ED Provider Notes (Signed)
[facility] Emergency Department Provider Note  ____________________________________________  Time seen: Approximately 315 PM  I have reviewed the triage vital signs and the nursing notes.   HISTORY  Chief Complaint Flank Pain    HPI Tracy Mason is a 39 y.o. female with right flank as well as right lower quadrant abdominal pain. She says that she has had multiple kidney stones in the past and has needed lithotripsy once. Her urologist is Dr. Jacqlyn Larsen. She says that she takes Flomax and Urocit-K. She says that the pain started at 9 AM and was mild but then had sudden onset worsening at noon. She says that at this time she is nauseous but without any vomiting. Her pain now after fentanyl in triage is a 7 out of 10. Denies any blood in her urine as well as denies vaginal bleeding or discharge. Has a history of an appendectomy as well as cholecystectomy.   Past Medical History  Diagnosis Date  . Nephrolithiasis     Approximately 20-25 episodes  . Kidney stones   . Ovarian cyst     Patient Active Problem List   Diagnosis Date Noted  . Routine general medical examination at a health care facility 06/11/2013    Past Surgical History  Procedure Laterality Date  . Cholecystectomy  2001  . Breast surgery Right 1993    benign biopsy  . Appendectomy  1988  . Uterine fibroid surgery  2014    benign    No current outpatient prescriptions on file.  Allergies Biaxin  Family History  Problem Relation Age of Onset  . Diabetes Mother   . Heart disease Father   . Diabetes Maternal Grandmother     Social History Social History  Substance Use Topics  . Smoking status: Former Smoker -- 10 years    Quit date: 05/16/2004  . Smokeless tobacco: Never Used  . Alcohol Use: No    Review of Systems Constitutional: No fever/chills Eyes: No visual changes. ENT: No sore throat. Cardiovascular: Denies chest pain. Respiratory: Denies shortness of  breath. Gastrointestinal: No  vomiting.  No diarrhea.  No constipation. Genitourinary: Negative for dysuria. Musculoskeletal: right lower back and flank pain Skin: Negative for rash. Neurological: Negative for headaches, focal weakness or numbness.  10-point ROS otherwise negative.  ____________________________________________   PHYSICAL EXAM:  VITAL SIGNS: ED Triage Vitals  Enc Vitals Group     BP 04/14/15 1323 145/73 mmHg     Pulse Rate 04/14/15 1323 72     Resp 04/14/15 1323 18     Temp 04/14/15 1323 98.2 F (36.8 C)     Temp Source 04/14/15 1323 Oral     SpO2 04/14/15 1323 98 %     Weight 04/14/15 1323 155 lb (70.308 kg)     Height 04/14/15 1323 5' 2"  (1.575 m)     Head Cir --      Peak Flow --      Pain Score 04/14/15 1323 8     Pain Loc --      Pain Edu? --      Excl. in [place]? --     Constitutional: Alert and oriented. Well appearing and in no acute distress. Eyes: Conjunctivae are normal. PERRL. EOMI. Head: Atraumatic. Nose: No congestion/rhinnorhea. Mouth/Throat: Mucous membranes are moist.  Oropharynx non-erythematous. Neck: No stridor.   Cardiovascular: Normal rate, regular rhythm. Grossly normal heart sounds.  Good peripheral circulation. Respiratory: Normal respiratory effort.  No retractions. Lungs CTAB. Gastrointestinal: Soft and nontender.  No distention. No abdominal bruits. No CVA tenderness. Musculoskeletal: No lower extremity tenderness nor edema.  No joint effusions. Neurologic:  Normal speech and language. No gross focal neurologic deficits are appreciated. No gait instability. Skin:  Skin is warm, dry and intact. No rash noted. Psychiatric: Mood and affect are normal. Speech and behavior are normal.  ____________________________________________   LABS (all labs ordered are listed, but only abnormal results are displayed)  Labs Reviewed  COMPREHENSIVE METABOLIC PANEL - Abnormal; Notable for the following:    CO2 21 (*)    Glucose, Bld 108 (*)     All other components within normal limits  CBC - Abnormal; Notable for the following:    WBC 12.3 (*)    All other components within normal limits  URINALYSIS COMPLETEWITH MICROSCOPIC ([facility] ONLY) - Abnormal; Notable for the following:    Color, Urine YELLOW (*)    APPearance CLEAR (*)    Hgb urine dipstick 1+ (*)    Squamous Epithelial / LPF 0-5 (*)    All other components within normal limits  POCT PREGNANCY, URINE   ____________________________________________  EKG   ____________________________________________  RADIOLOGY  7 mm nonobstructing calculus in the lower pole of the right kidney. Otherwise unremarkable study. ____________________________________________   PROCEDURES    ____________________________________________   INITIAL IMPRESSION / ASSESSMENT AND PLAN / ED COURSE  Pertinent labs & imaging results that were available during my care of the patient were reviewed by me and considered in my medical decision making (see chart for details). ----------------------------------------- 5:58 PM on 04/14/2015 -----------------------------------------  Patient reassessed and after second dose of fentanyl is now with the pain score to 3 out of 10. No further nausea. Known vomiting in the emergency department. Patient with baseline leukocytosis. Urine does not appear infected. Says feels similar to previous stones and the reason she came to the emergency department as to "stay ahead of it." The patient has Flomax at home which she says she takes only when she has a stone. We will load her with Flomax here and she will continue to take it at home. She'll be given a urine strainer and discharged. She will follow-up with Dr. Gregary Cromer, her outpatient neurologist. The patient and her husband were instructed about return precautions including fever and increased pain. They understand the plan and are willing to comply. ____________________________________________   FINAL  CLINICAL IMPRESSION(S) / ED DIAGNOSES  Acute flank pain. Acute ureterolithiasis. Initial visit.    Orbie Pyo, MD 04/14/15 [phone]

## 2015-04-16 ENCOUNTER — Ambulatory Visit: Payer: Self-pay | Admitting: Obstetrics and Gynecology

## 2015-05-24 ENCOUNTER — Encounter: Payer: Self-pay | Admitting: *Deleted

## 2015-05-24 ENCOUNTER — Emergency Department: Payer: 59

## 2015-05-24 ENCOUNTER — Emergency Department
Admission: EM | Admit: 2015-05-24 | Discharge: 2015-05-24 | Disposition: A | Discharge status: Home / Self Care | Payer: 59 | Attending: Emergency Medicine | Admitting: Emergency Medicine

## 2015-05-24 DIAGNOSIS — R109 Unspecified abdominal pain: Secondary | ICD-10-CM | POA: Diagnosis present

## 2015-05-24 DIAGNOSIS — Z3202 Encounter for pregnancy test, result negative: Secondary | ICD-10-CM | POA: Insufficient documentation

## 2015-05-24 DIAGNOSIS — Z87891 Personal history of nicotine dependence: Secondary | ICD-10-CM | POA: Diagnosis not present

## 2015-05-24 LAB — URINALYSIS COMPLETE WITH MICROSCOPIC (ARMC ONLY)
BILIRUBIN URINE: NEGATIVE
Glucose, UA: NEGATIVE mg/dL
Hgb urine dipstick: NEGATIVE
Ketones, ur: NEGATIVE mg/dL
Leukocytes, UA: NEGATIVE
Nitrite: NEGATIVE
PH: 6 (ref 5.0–8.0)
Protein, ur: NEGATIVE mg/dL
Specific Gravity, Urine: 1.02 (ref 1.005–1.030)

## 2015-05-24 LAB — PREGNANCY, URINE: PREG TEST UR: NEGATIVE

## 2015-05-24 LAB — CBC
HCT: 40.1 % (ref 35.0–47.0)
HEMOGLOBIN: 13.5 g/dL (ref 12.0–16.0)
MCH: 29.1 pg (ref 26.0–34.0)
MCHC: 33.7 g/dL (ref 32.0–36.0)
MCV: 86.6 fL (ref 80.0–100.0)
Platelets: 288 10*3/uL (ref 150–440)
RBC: 4.63 MIL/uL (ref 3.80–5.20)
RDW: 12.3 % (ref 11.5–14.5)
WBC: 12.4 10*3/uL — AB (ref 3.6–11.0)

## 2015-05-24 LAB — BASIC METABOLIC PANEL
ANION GAP: 5 (ref 5–15)
BUN: 13 mg/dL (ref 6–20)
CALCIUM: 9.3 mg/dL (ref 8.9–10.3)
CO2: 26 mmol/L (ref 22–32)
Chloride: 108 mmol/L (ref 101–111)
Creatinine, Ser: 0.78 mg/dL (ref 0.44–1.00)
Glucose, Bld: 103 mg/dL — ABNORMAL HIGH (ref 65–99)
Potassium: 4 mmol/L (ref 3.5–5.1)
Sodium: 139 mmol/L (ref 135–145)

## 2015-05-24 MED ORDER — IBUPROFEN 800 MG PO TABS: 800.0000 mg | ORAL_TABLET | Freq: Three times a day (TID) | ORAL | Status: DC | PRN

## 2015-05-24 MED ORDER — DIAZEPAM 5 MG PO TABS: 5.0000 mg | ORAL_TABLET | Freq: Three times a day (TID) | ORAL | Status: DC | PRN

## 2015-05-24 MED ORDER — OXYCODONE-ACETAMINOPHEN 5-325 MG PO TABS
2.0000 | ORAL_TABLET | Freq: Once | ORAL | Status: AC
  Administered 2015-05-24: 2 via ORAL
  Filled 2015-05-24: qty 2

## 2015-05-24 NOTE — ED Provider Notes (Signed)
[facility] Emergency Department Provider Note     Time seen: ----------------------------------------- 6:32 PM on 05/24/2015 -----------------------------------------    I have reviewed the triage vital signs and the nursing notes.   HISTORY  Chief Complaint Flank Pain    HPI Tracy Mason is a 39 y.o. female who presents ER with left-sided flank pain. Patient reports he has a history of kidney stones and this feels similar.patient reports the pain has worsened over the last several days, as were reported history of passing about 30 kidney stones. She's not had any right-sided pain, denies any other complaints including vomiting.   Past Medical History  Diagnosis Date  . Nephrolithiasis     Approximately 20-25 episodes  . Kidney stones   . Ovarian cyst     Patient Active Problem List   Diagnosis Date Noted  . Routine general medical examination at a health care facility 06/11/2013    Past Surgical History  Procedure Laterality Date  . Cholecystectomy  2001  . Breast surgery Right 1993    benign biopsy  . Appendectomy  1988  . Uterine fibroid surgery  2014    benign    Allergies Biaxin  Social History Social History  Substance Use Topics  . Smoking status: Former Smoker -- 10 years    Quit date: 05/16/2004  . Smokeless tobacco: Never Used  . Alcohol Use: No    Review of Systems Constitutional: Negative for fever. Eyes: Negative for visual changes. ENT: Negative for sore throat. Cardiovascular: Negative for chest pain. Respiratory: Negative for shortness of breath. Gastrointestinal: positive for flank pain Genitourinary: Negative for dysuria. Musculoskeletal: Negative for back pain. Skin: Negative for rash. Neurological: Negative for headaches, focal weakness or numbness.  10-point ROS otherwise negative.  ____________________________________________   PHYSICAL EXAM:  VITAL SIGNS: ED Triage Vitals  Enc Vitals  Group     BP 05/24/15 1606 161/93 mmHg     Pulse Rate 05/24/15 1606 91     Resp 05/24/15 1606 18     Temp 05/24/15 1606 98.4 F (36.9 C)     Temp Source 05/24/15 1606 Oral     SpO2 05/24/15 1606 98 %     Weight 05/24/15 1606 155 lb (70.308 kg)     Height 05/24/15 1606 5' 2"  (1.575 m)     Head Cir --      Peak Flow --      Pain Score 05/24/15 1607 8     Pain Loc --      Pain Edu? --      Excl. in [place]? --     Constitutional: Alert and oriented. Well appearing and in no distress. Eyes: Conjunctivae are normal. PERRL. Normal extraocular movements. ENT   Head: Normocephalic and atraumatic.   Nose: No congestion/rhinnorhea.   Mouth/Throat: Mucous membranes are moist.   Neck: No stridor. Cardiovascular: Normal rate, regular rhythm. Normal and symmetric distal pulses are present in all extremities. No murmurs, rubs, or gallops. Respiratory: Normal respiratory effort without tachypnea nor retractions. Breath sounds are clear and equal bilaterally. No wheezes/rales/rhonchi. Gastrointestinal: Soft and nontender. No distention. No abdominal bruits.  Musculoskeletal: Nontender with normal range of motion in all extremities. No joint effusions.  No lower extremity tenderness nor edema. Neurologic:  Normal speech and language. No gross focal neurologic deficits are appreciated. Speech is normal. No gait instability. Skin:  Skin is warm, dry and intact. No rash noted. Psychiatric: Mood and affect are normal. Speech and behavior are normal. Patient  exhibits appropriate insight and judgment. ____________________________________________  ED COURSE:  Pertinent labs & imaging results that were available during my care of the patient were reviewed by me and considered in my medical decision making (see chart for details). Unclear if this is renal colic or not, will check labs and urine and possible CT. ____________________________________________    LABS (pertinent  positives/negatives)  Labs Reviewed  URINALYSIS COMPLETEWITH MICROSCOPIC ([place]) - Abnormal; Notable for the following:    Color, Urine YELLOW (*)    APPearance CLEAR (*)    Bacteria, UA RARE (*)    Squamous Epithelial / LPF 0-5 (*)    All other components within normal limits  BASIC METABOLIC PANEL - Abnormal; Notable for the following:    Glucose, Bld 103 (*)    All other components within normal limits  CBC - Abnormal; Notable for the following:    WBC 12.4 (*)    All other components within normal limits  PREGNANCY, URINE    RADIOLOGY Images were viewed by me  CT renal protocol is unremarkable ____________________________________________  FINAL ASSESSMENT AND PLAN  Flank pain  Plan: Patient with labs and imaging as dictated above. Patient denies vaginal complaints. Unclear etiology for her flank pain. She is possibly having pain from adhesions from prior surgery. She stable for discharge with Motrin and Valium.   Earleen Newport, MD   Earleen Newport, MD 05/24/15 2016

## 2015-05-24 NOTE — Discharge Instructions (Signed)
Flank Pain Flank pain refers to pain that is located on the side of the body between the upper abdomen and the back. The pain may occur over a short period of time (acute) or may be long-term or reoccurring (chronic). It may be mild or severe. Flank pain can be caused by many things. CAUSES  Some of the more common causes of flank pain include:  Muscle strains.   Muscle spasms.   A disease of your spine (vertebral disk disease).   A lung infection (pneumonia).   Fluid around your lungs (pulmonary edema).   A kidney infection.   Kidney stones.   A very painful skin rash caused by the chickenpox virus (shingles).   Gallbladder disease.  [place] care will depend on the cause of your pain. In general,  Rest as directed by your caregiver.  Drink enough fluids to keep your urine clear or pale yellow.  Only take over-the-counter or prescription medicines as directed by your caregiver. Some medicines may help relieve the pain.  Tell your caregiver about any changes in your pain.  Follow up with your caregiver as directed. SEEK IMMEDIATE MEDICAL CARE IF:   Your pain is not controlled with medicine.   You have new or worsening symptoms.  Your pain increases.   You have abdominal pain.   You have shortness of breath.   You have persistent nausea or vomiting.   You have swelling in your abdomen.   You feel faint or pass out.   You have blood in your urine.  You have a fever or persistent symptoms for more than 2-3 days.  You have a fever and your symptoms suddenly get worse. MAKE SURE YOU:   Understand these instructions.  Will watch your condition.  Will get help right away if you are not doing well or get worse.   This information is not intended to replace advice given to you by your health care provider. Make sure you discuss any questions you have with your health care provider.   Document Released: 09/23/2005 Document  Revised: 04/26/2012 Document Reviewed: 03/16/2012 Elsevier Interactive Patient Education [employer].

## 2015-05-24 NOTE — ED Notes (Signed)
Spoke with Dr. Jimmye Norman re: pain medication for patient.  Will order after CT scan done.

## 2015-05-24 NOTE — ED Notes (Signed)
Lab notified to add urine pregnancy.

## 2015-05-24 NOTE — ED Notes (Signed)
Pt states left sided flank pain, states hx of kidney stones

## 2015-08-20 ENCOUNTER — Encounter: Payer: Self-pay | Admitting: *Deleted

## 2015-08-21 ENCOUNTER — Encounter: Payer: Self-pay | Admitting: Obstetrics and Gynecology

## 2015-08-21 ENCOUNTER — Ambulatory Visit (INDEPENDENT_AMBULATORY_CARE_PROVIDER_SITE_OTHER): Payer: 59 | Admitting: Obstetrics and Gynecology

## 2015-08-21 VITALS — BP 140/80 | HR 80 | Ht 62.0 in | Wt 163.3 lb

## 2015-08-21 DIAGNOSIS — N946 Dysmenorrhea, unspecified: Secondary | ICD-10-CM

## 2015-08-21 DIAGNOSIS — E663 Overweight: Secondary | ICD-10-CM

## 2015-08-21 MED ORDER — PHENTERMINE HCL 37.5 MG PO TABS: 37.5000 mg | ORAL_TABLET | Freq: Every day | ORAL | Status: DC

## 2015-08-21 MED ORDER — DESOGESTREL-ETHINYL ESTRADIOL 0.15-30 MG-MCG PO TABS: 1.0000 | ORAL_TABLET | Freq: Every day | ORAL | Status: DC

## 2015-08-21 MED ORDER — CYANOCOBALAMIN 1000 MCG/ML IJ SOLN: 1000.0000 ug | Freq: Once | INTRAMUSCULAR | Status: DC

## 2015-08-21 MED ORDER — OXYCODONE-ACETAMINOPHEN 5-325 MG PO TABS: 1.0000 | ORAL_TABLET | Freq: Four times a day (QID) | ORAL | Status: DC | PRN

## 2015-08-21 NOTE — Progress Notes (Signed)
Subjective:     Patient ID: Tracy Mason, female   DOB: 1976-02-13, 40 y.o.   MRN: [id number]  HPI Nexplon removed in May for DUB and switched to OCPs- now menses 4-5 days with normal flow but extremely painful day before and day of onset of menses- has to leave work at times.  Review of Systems See above Desires weight loss plan    Objective:   Physical Exam A&O x4 Well groomed female in no distress Filed Vitals:   08/21/15 0824  Height: 5' 2"  (1.575 m)  Weight: 163 lb 4.8 oz (74.072 kg)      Assessment:     Dysmenorrhea obesity     Plan:     Change to taking current OCP cntinuously for 3 packs, and then only 4 of placebos then, repeating. New start adipex and B12 for weight loss, commited to 8 bottles water daily and 4 days of exercise weekly. First B12 given today, RTC 4 weeks.      Melody Shambley,CNM

## 2015-09-15 ENCOUNTER — Other Ambulatory Visit: Payer: Self-pay | Admitting: *Deleted

## 2015-09-15 ENCOUNTER — Telehealth: Payer: Self-pay | Admitting: Obstetrics and Gynecology

## 2015-09-15 MED ORDER — CYANOCOBALAMIN 1000 MCG/ML IJ SOLN: 1000.0000 ug | Freq: Once | INTRAMUSCULAR | Status: DC

## 2015-09-15 NOTE — Telephone Encounter (Signed)
Sent rx to pharmacy

## 2015-09-15 NOTE — Telephone Encounter (Signed)
b12 was never sent to pharmcay ---[place]

## 2015-09-17 ENCOUNTER — Ambulatory Visit: Payer: 59

## 2015-09-22 ENCOUNTER — Ambulatory Visit (INDEPENDENT_AMBULATORY_CARE_PROVIDER_SITE_OTHER): Payer: 59 | Admitting: Obstetrics and Gynecology

## 2015-09-22 VITALS — BP 134/85 | HR 77 | Ht 62.0 in | Wt 161.2 lb

## 2015-09-22 DIAGNOSIS — E663 Overweight: Secondary | ICD-10-CM | POA: Diagnosis not present

## 2015-09-22 MED ORDER — CYANOCOBALAMIN 1000 MCG/ML IJ SOLN
1000.0000 ug | Freq: Once | INTRAMUSCULAR | Status: AC
  Administered 2015-09-22: 1000 ug via INTRAMUSCULAR

## 2015-09-22 NOTE — Progress Notes (Cosign Needed)
Patient ID: Tracy Mason, female   DOB: 1976-06-30, 40 y.o.   MRN: [id number]   Pt presents for wt, bp, and b12 inj. NO s/e from adipex. Weight is down 2#. Pt is trying to eat healthy and exercise.

## 2015-10-20 ENCOUNTER — Ambulatory Visit (INDEPENDENT_AMBULATORY_CARE_PROVIDER_SITE_OTHER): Payer: 59 | Admitting: Obstetrics and Gynecology

## 2015-10-20 VITALS — BP 137/87 | HR 75 | Wt 157.0 lb

## 2015-10-20 DIAGNOSIS — E663 Overweight: Secondary | ICD-10-CM

## 2015-10-20 MED ORDER — CYANOCOBALAMIN 1000 MCG/ML IJ SOLN
1000.0000 ug | Freq: Once | INTRAMUSCULAR | Status: AC
  Administered 2015-10-20: 1000 ug via INTRAMUSCULAR

## 2015-10-20 NOTE — Progress Notes (Signed)
Patient ID: Tracy Mason, female   DOB: 08/19/1975, 40 y.o.   MRN: [id number] Pt presents for weight, B/P, B-12 injection. No side effects of medication-Phentermine, or B-12.  Weight loss of 4 lbs. Encouraged eating healthy and exercise.

## 2015-10-21 ENCOUNTER — Encounter: Payer: Self-pay | Admitting: Obstetrics and Gynecology

## 2015-11-19 ENCOUNTER — Ambulatory Visit (INDEPENDENT_AMBULATORY_CARE_PROVIDER_SITE_OTHER): Payer: 59 | Admitting: Obstetrics and Gynecology

## 2015-11-19 ENCOUNTER — Encounter: Payer: Self-pay | Admitting: Obstetrics and Gynecology

## 2015-11-19 VITALS — BP 149/84 | HR 81 | Ht 62.0 in | Wt 151.0 lb

## 2015-11-19 DIAGNOSIS — E663 Overweight: Secondary | ICD-10-CM

## 2015-11-19 MED ORDER — CYANOCOBALAMIN 1000 MCG/ML IJ SOLN: 1000.0000 ug | Freq: Once | INTRAMUSCULAR | Status: DC

## 2015-11-19 MED ORDER — PHENTERMINE HCL 37.5 MG PO TABS: 37.5000 mg | ORAL_TABLET | Freq: Every day | ORAL | Status: DC

## 2015-11-19 MED ORDER — OXYCODONE-ACETAMINOPHEN 5-325 MG PO TABS: 1.0000 | ORAL_TABLET | Freq: Four times a day (QID) | ORAL | Status: DC | PRN

## 2015-11-19 NOTE — Progress Notes (Signed)
SUBJECTIVE:  40 y.o. here for follow-up weight loss visit, previously seen 4 weeks ago. Denies any concerns and feels like medication is working well with a 16 # weight loss. Desires to continue to get BMI less than 25.  OBJECTIVE:  BP 149/84 mmHg  Pulse 81  Ht 5' 2"  (1.575 m)  Wt 151 lb (68.493 kg)  BMI 27.61 kg/m2  Body mass index is 27.61 kg/(m^2). Patient appears well. ASSESSMENT:  Obesity- responding well to weight loss plan PLAN:  To continue with current medications after a 2 week break on phentermine. B12 102mg/ml injection given RTC in 6 weeks as planned  Tracy Mason SHenderson CNM

## 2015-12-09 ENCOUNTER — Telehealth: Payer: Self-pay | Admitting: *Deleted

## 2015-12-09 NOTE — Telephone Encounter (Signed)
Patient stated that she seen Melody in Jan for heavy bleeding , Melody wanted her to take her birth pill non stop for 3 months and skip the placebo week. Patient states that she is into her second pack and she started her cycle yesterday. Patient wanted to know what she needs to do?  Patient is requesting a call back. Call back number [phone]

## 2015-12-09 NOTE — Telephone Encounter (Signed)
Left detailed message for pt

## 2015-12-09 NOTE — Telephone Encounter (Signed)
pls advise

## 2015-12-09 NOTE — Telephone Encounter (Signed)
It is common to have menses break through when first starting continuous cycling with pills, to keep taking them as prescribed and the bleeding should stop again. If it doesn't she can take 2 pills a day for 2-3 days to stop it.

## 2015-12-31 ENCOUNTER — Ambulatory Visit (INDEPENDENT_AMBULATORY_CARE_PROVIDER_SITE_OTHER): Payer: 59 | Admitting: Obstetrics and Gynecology

## 2015-12-31 VITALS — BP 139/93 | HR 90 | Wt 149.4 lb

## 2015-12-31 DIAGNOSIS — E663 Overweight: Secondary | ICD-10-CM

## 2015-12-31 MED ORDER — CYANOCOBALAMIN 1000 MCG/ML IJ SOLN
1000.0000 ug | Freq: Once | INTRAMUSCULAR | Status: AC
  Administered 2015-12-31: 1000 ug via INTRAMUSCULAR

## 2015-12-31 NOTE — Progress Notes (Signed)
Patient ID: Tracy Mason, female   DOB: 1976-08-06, 40 y.o.   MRN: [id number] Pt presents for weight, B/P, B-12 injection. No side effects of medication-Phentermine, or B-12.  Weight loss of  12  lbs. Encouraged eating healthy and exercise.

## 2016-01-30 ENCOUNTER — Ambulatory Visit: Payer: 59

## 2016-02-04 ENCOUNTER — Ambulatory Visit: Payer: 59

## 2016-02-06 ENCOUNTER — Ambulatory Visit (INDEPENDENT_AMBULATORY_CARE_PROVIDER_SITE_OTHER): Payer: 59 | Admitting: Obstetrics and Gynecology

## 2016-02-06 VITALS — BP 142/80 | HR 73 | Ht 62.0 in | Wt 148.5 lb

## 2016-02-06 DIAGNOSIS — E663 Overweight: Secondary | ICD-10-CM

## 2016-02-06 MED ORDER — CYANOCOBALAMIN 1000 MCG/ML IJ SOLN
1000.0000 ug | Freq: Once | INTRAMUSCULAR | Status: AC
  Administered 2016-02-06: 1000 ug via INTRAMUSCULAR

## 2016-02-06 NOTE — Progress Notes (Signed)
Patient ID: Tracy Mason, female   DOB: 01-05-1976, 40 y.o.   MRN: [id number]   Pt presents for 4 week f/u wt, bp, and b12. Pt is down 1#. NO s/e from medication. Pt states she did not have a good month, as she recently was on vacation. Pt will f/u in 4 weeks.

## 2016-03-05 ENCOUNTER — Ambulatory Visit (INDEPENDENT_AMBULATORY_CARE_PROVIDER_SITE_OTHER): Payer: 59 | Admitting: Obstetrics and Gynecology

## 2016-03-05 VITALS — BP 154/94 | HR 82 | Wt 148.1 lb

## 2016-03-05 DIAGNOSIS — E663 Overweight: Secondary | ICD-10-CM | POA: Diagnosis not present

## 2016-03-05 MED ORDER — CYANOCOBALAMIN 1000 MCG/ML IJ SOLN
1000.0000 ug | Freq: Once | INTRAMUSCULAR | Status: AC
  Administered 2016-03-05: 1000 ug via INTRAMUSCULAR

## 2016-03-05 NOTE — Progress Notes (Signed)
Patient ID: Tracy Mason, female   DOB: 1976-01-02, 40 y.o.   MRN: [id number]  Pt presents for weight, B/P, B-12 injection. No side effects of medication-Phentermine, or B-12.  Weight stable at 148 lbs. Encouraged eating healthy and exercise. Pt to see MNS next visit. B/P 154/94. No c/o hypertensive symptoms.

## 2016-04-05 ENCOUNTER — Ambulatory Visit (INDEPENDENT_AMBULATORY_CARE_PROVIDER_SITE_OTHER): Payer: 59 | Admitting: Obstetrics and Gynecology

## 2016-04-05 ENCOUNTER — Encounter: Payer: Self-pay | Admitting: Obstetrics and Gynecology

## 2016-04-05 VITALS — BP 142/88 | HR 98 | Ht 62.0 in | Wt 145.9 lb

## 2016-04-05 DIAGNOSIS — E663 Overweight: Secondary | ICD-10-CM | POA: Diagnosis not present

## 2016-04-05 MED ORDER — OXYCODONE-ACETAMINOPHEN 5-325 MG PO TABS: 1.0000 | ORAL_TABLET | Freq: Four times a day (QID) | ORAL | 0 refills | Status: DC | PRN

## 2016-04-05 MED ORDER — CYANOCOBALAMIN 1000 MCG/ML IJ SOLN: 1000.0000 ug | Freq: Once | INTRAMUSCULAR | 1 refills | Status: AC

## 2016-04-05 MED ORDER — PHENTERMINE HCL 37.5 MG PO TABS: 37.5000 mg | ORAL_TABLET | Freq: Every day | ORAL | 2 refills | Status: DC

## 2016-04-05 NOTE — Progress Notes (Signed)
SUBJECTIVE:  40 y.o. here for follow-up weight loss visit, previously seen 4 weeks ago. Denies any concerns and feels like medication worked well. Desires 13 more #s off. Joined boot camp at gym, feels refocused  OBJECTIVE:  BP (!) 142/88   Pulse 98   Ht 5' 2"  (1.575 m)   Wt 145 lb 14.4 oz (66.2 kg)   LMP 03/31/2016   BMI 26.69 kg/m   Body mass index is 26.69 kg/m. Patient appears well. ASSESSMENT:  Obesity- responding well to weight loss plan PLAN:  To continue with current medications. B12 1079mg/ml injection given RTC in 4 weeks as planned  Kees Idrovo SSheffield CNM

## 2016-05-03 ENCOUNTER — Ambulatory Visit (INDEPENDENT_AMBULATORY_CARE_PROVIDER_SITE_OTHER): Payer: 59 | Admitting: Obstetrics and Gynecology

## 2016-05-03 VITALS — BP 154/94 | HR 83 | Wt 142.3 lb

## 2016-05-03 DIAGNOSIS — E663 Overweight: Secondary | ICD-10-CM | POA: Diagnosis not present

## 2016-05-03 MED ORDER — CYANOCOBALAMIN 1000 MCG/ML IJ SOLN
1000.0000 ug | Freq: Once | INTRAMUSCULAR | Status: AC
  Administered 2016-05-03: 1000 ug via INTRAMUSCULAR

## 2016-05-03 NOTE — Progress Notes (Signed)
Patient ID: Tracy Mason, female   DOB: 22-Dec-1975, 40 y.o.   MRN: [id number] Pt presents for weight, B/P, B-12 injection. No side effects of medication-Phentermine, or B-12.  Weight loss/gain of  3 lbs. Encouraged eating healthy and exercise.

## 2016-05-04 DIAGNOSIS — G4733 Obstructive sleep apnea (adult) (pediatric): Secondary | ICD-10-CM | POA: Insufficient documentation

## 2016-05-04 DIAGNOSIS — Z8659 Personal history of other mental and behavioral disorders: Secondary | ICD-10-CM | POA: Insufficient documentation

## 2016-05-31 ENCOUNTER — Ambulatory Visit (INDEPENDENT_AMBULATORY_CARE_PROVIDER_SITE_OTHER): Payer: 59 | Admitting: Obstetrics and Gynecology

## 2016-05-31 VITALS — BP 144/98 | HR 81 | Ht 62.0 in | Wt 144.0 lb

## 2016-05-31 DIAGNOSIS — E663 Overweight: Secondary | ICD-10-CM | POA: Diagnosis not present

## 2016-05-31 MED ORDER — CYANOCOBALAMIN 1000 MCG/ML IJ SOLN
1000.0000 ug | Freq: Once | INTRAMUSCULAR | Status: AC
  Administered 2016-05-31: 1000 ug via INTRAMUSCULAR

## 2016-05-31 NOTE — Progress Notes (Signed)
Patient ID: Tracy Mason, female   DOB: Nov 29, 1975, 40 y.o.   MRN: [id number]  Pt presents for weight, B/P, B-12 injection. No side effects of medication-Phentermine, or B-12.  Weight gain of _2_ lbs. Encouraged eating healthy and exercise. B/P elevated this visit. B/P-167/99,(right arm) P-73; B/P-159/103, (left arm) P-81; B/P 144/98 (manual left arm). Pt to make a 1 week f/u to have B/P check and to check it at home if possible. No c/o any HTN symptoms and pt to contact office if she does. To go to ER if shortness of breath or chest pain.

## 2016-06-07 ENCOUNTER — Ambulatory Visit (INDEPENDENT_AMBULATORY_CARE_PROVIDER_SITE_OTHER): Payer: 59 | Admitting: Obstetrics and Gynecology

## 2016-06-07 VITALS — BP 132/94

## 2016-06-07 DIAGNOSIS — R030 Elevated blood-pressure reading, without diagnosis of hypertension: Secondary | ICD-10-CM

## 2016-06-07 NOTE — Progress Notes (Signed)
Patient ID: Tracy Mason, female   DOB: 12-Jul-1976, 41 y.o.   MRN: [id number] Pt returns to have B/P Recheck due to elevated B/P's last week. Today B/P 132/94 (improved). She states at home the average B/P range 150/90. Pt aware that phentermine will cause B/P to be elevated and she stated this is the last month she is taking it. Pt states she is going to cut back dose to either 1/2 tab daily or 1 tab every other day. To continue monitoring b/p at home and did advise pt that elevated B/P symptoms can be silent and be mindful of this.

## 2016-08-03 ENCOUNTER — Ambulatory Visit (INDEPENDENT_AMBULATORY_CARE_PROVIDER_SITE_OTHER): Payer: 59 | Admitting: Obstetrics and Gynecology

## 2016-08-03 ENCOUNTER — Encounter: Payer: Self-pay | Admitting: Obstetrics and Gynecology

## 2016-08-03 VITALS — BP 160/100 | HR 98 | Ht 62.0 in | Wt 153.0 lb

## 2016-08-03 DIAGNOSIS — I10 Essential (primary) hypertension: Secondary | ICD-10-CM

## 2016-08-03 DIAGNOSIS — Z3040 Encounter for surveillance of contraceptives, unspecified: Secondary | ICD-10-CM | POA: Diagnosis not present

## 2016-08-03 MED ORDER — HYDROCHLOROTHIAZIDE 25 MG PO TABS: 25.0000 mg | ORAL_TABLET | Freq: Every day | ORAL | 6 refills | Status: DC

## 2016-08-03 MED ORDER — OXYCODONE-ACETAMINOPHEN 5-325 MG PO TABS: 1.0000 | ORAL_TABLET | Freq: Four times a day (QID) | ORAL | 0 refills | Status: DC | PRN

## 2016-08-03 MED ORDER — NORETHINDRONE 0.35 MG PO TABS: 1.0000 | ORAL_TABLET | Freq: Every day | ORAL | 11 refills | Status: DC

## 2016-08-03 NOTE — Progress Notes (Signed)
Subjective:     Patient ID: Elizebeth Koller, female   DOB: 03-19-1976, 40 y.o.   MRN: [id number]  HPI Reports an continued elevated BPO when checking at home. Has not taken phentermine in 3 weeks with no change in BP. She thinks it may be associated with changing from Nexplanon to OCPs as that is when it started going up.  Review of Systems negative    Objective:   Physical Exam A&O x4 Well groomed female in no distress Blood pressure (!) 160/100, pulse 98, height 5' 2"  (1.575 m), weight 153 lb (69.4 kg), last menstrual period 07/04/2016. No PE indicated    Assessment:     Hypertension Contraception management    Plan:     Started on HCTZ 25 mg daily Switched to [employer] and will consider IUD in future. To continue to check BP daily at home and send numbers though MyChart RTC in 3 weeks for BP check  Melody Newport, CNM

## 2016-08-24 ENCOUNTER — Ambulatory Visit (INDEPENDENT_AMBULATORY_CARE_PROVIDER_SITE_OTHER): Payer: 59 | Admitting: Obstetrics and Gynecology

## 2016-08-24 VITALS — BP 127/83 | HR 76 | Ht 62.0 in | Wt 155.2 lb

## 2016-08-24 DIAGNOSIS — I10 Essential (primary) hypertension: Secondary | ICD-10-CM

## 2016-08-24 NOTE — Progress Notes (Signed)
Patient ID: Tracy Mason, female   DOB: 1975/10/10, 41 y.o.   MRN: [id number] Pt presents for f/u for B/P check. Was prescribed HCTZ. B/P was elevated previously and pt was taken off phentermine/B-12.B/P-127/83 today in office. B/P 's taken at home were within the range of B/P-135/82--154/96. At this point pt is not a candidate for phentermine. Pt to continue taking B/P every other day at home and to send readings in 1 month to MNS thru my chart and return to office in 6 mos for f/u appt with [facility]NS.

## 2016-09-20 ENCOUNTER — Encounter: Payer: Self-pay | Admitting: Obstetrics and Gynecology

## 2016-09-21 ENCOUNTER — Other Ambulatory Visit: Payer: Self-pay | Admitting: *Deleted

## 2016-09-21 MED ORDER — PHENTERMINE HCL 37.5 MG PO TABS: 37.5000 mg | ORAL_TABLET | Freq: Every day | ORAL | 2 refills | Status: DC

## 2016-10-14 DIAGNOSIS — N200 Calculus of kidney: Secondary | ICD-10-CM | POA: Diagnosis not present

## 2016-10-14 DIAGNOSIS — Z87442 Personal history of urinary calculi: Secondary | ICD-10-CM | POA: Diagnosis not present

## 2016-10-28 ENCOUNTER — Ambulatory Visit (INDEPENDENT_AMBULATORY_CARE_PROVIDER_SITE_OTHER): Payer: 59 | Admitting: Certified Nurse Midwife

## 2016-10-28 ENCOUNTER — Encounter: Payer: Self-pay | Admitting: Certified Nurse Midwife

## 2016-10-28 VITALS — BP 127/84 | HR 86 | Wt 152.9 lb

## 2016-10-28 DIAGNOSIS — N764 Abscess of vulva: Secondary | ICD-10-CM | POA: Diagnosis not present

## 2016-10-28 MED ORDER — CEFDINIR 300 MG PO CAPS: 300.0000 mg | ORAL_CAPSULE | Freq: Two times a day (BID) | ORAL | 0 refills | Status: DC

## 2016-10-28 MED ORDER — FLUCONAZOLE 150 MG PO TABS: 150.0000 mg | ORAL_TABLET | Freq: Every day | ORAL | 0 refills | Status: DC

## 2016-10-29 NOTE — Progress Notes (Signed)
GYN ENCOUNTER NOTE  Subjective:       Tracy Mason is a 41 y.o. G1P0 female is here for gynecologic evaluation of the following issues: "knot in groin crease since last Friday or Saturday".   Knot is tender to touch and painful when rubbed by undergarments. She reports minimal relief with warm compresses and tub soaks. No pus or drainage noted.   Denies difficulty breathing and respiratory distress, chest pain, abdominal pain, vaginal bleeding, and leg pain or swelling.    Gynecologic History  Patient's last menstrual period was 10/19/2016 (exact date).   Contraception: oral progesterone-only contraceptive   Last Pap: 09/05/2014. Results were: normal  Obstetric History OB History  Gravida Para Term Preterm AB Living  1         1  SAB TAB Ectopic Multiple Live Births          1    # Outcome Date GA Lbr Len/2nd Weight Sex Delivery Anes PTL Lv  1 Gravida 2006    F Vag-Spont   LIV      Past Medical History:  Diagnosis Date  . Hypertension   . Kidney stones   . Nephrolithiasis    Approximately 20-25 episodes  . Ovarian cyst   . Pelvic pain in female   . Sleep apnea     Past Surgical History:  Procedure Laterality Date  . APPENDECTOMY  1988  . BREAST SURGERY Right 1993   benign biopsy  . CHOLECYSTECTOMY  2001  . UTERINE FIBROID SURGERY  2014   benign    Current Outpatient Prescriptions on File Prior to Visit  Medication Sig Dispense Refill  . hydrochlorothiazide (HYDRODIURIL) 25 MG tablet Take 1 tablet (25 mg total) by mouth daily. 30 tablet 6  . ibuprofen (ADVIL,MOTRIN) 800 MG tablet     . norethindrone (MICRONOR,CAMILA,ERRIN) 0.35 MG tablet Take 1 tablet (0.35 mg total) by mouth daily. 1 Package 11  . oxyCODONE-acetaminophen (ROXICET) 5-325 MG tablet Take 1-2 tablets by mouth every 6 (six) hours as needed. 30 tablet 0  . phentermine (ADIPEX-P) 37.5 MG tablet Take 1 tablet (37.5 mg total) by mouth daily before breakfast. 30 tablet 2  . potassium citrate  (UROCIT-K) 10 MEQ (1080 MG) SR tablet Take by mouth.     No current facility-administered medications on file prior to visit.     Allergies  Allergen Reactions  . Biaxin [Clarithromycin] Hives    Social History   Social History  . Marital status: Married    Spouse name: Sherren Mocha  . Number of children: N/A  . Years of education: 69   Occupational History  . [employer]   Social History Main Topics  . Smoking status: Former Smoker    Years: 10.00    Quit date: 05/16/2004  . Smokeless tobacco: Never Used  . Alcohol use No  . Drug use: No  . Sexual activity: Yes    Birth control/ protection: Pill   Other Topics Concern  . Not on file   Social History Narrative   Martavia grew up in [place], [place]. She attended [employer] and obtained her Bachelor's in [place]. She currently works in the Programmer, multimedia for [employer]. She lives at home with her husband, Sherren Mocha, and their daughter, Thurmond Butts, age 29. She is very active in her church. She also is very active in volunteering at her daughter's school. She enjoys shopping, reading and going to the beach. They have two dogs, Jax and Cash.    Family  History  Problem Relation Age of Onset  . Diabetes Mother   . Heart disease Father   . Diabetes Maternal Grandmother     The following portions of the patient's history were reviewed and updated as appropriate: allergies, current medications, past family history, past medical history, past social history, past surgical history and problem list.  Review of Systems  Review of Systems - Negative except as noted above History obtained from the patient  Objective:   BP 127/84   Pulse 86   Wt 152 lb 14.4 oz (69.4 kg)   LMP 10/19/2016 (Exact Date)   BMI 27.97 kg/m    CONSTITUTIONAL: Well-developed, well-nourished female in no acute distress.   PELVIC: dime sized skin colored furuncle palpable on left labia majora, no drainage noted.  No lymphopathy noted.   Assessment:    Furuncle of the labia majora  Plan:   1. Continue home care measures like warm compress and tub soaks.   2. Rx Omnicef and Diflucan, see orders   3. RTC if symptoms worsen or do not improve    Diona Fanti, CNM

## 2016-11-12 ENCOUNTER — Encounter: Payer: Self-pay | Admitting: Obstetrics and Gynecology

## 2016-11-16 ENCOUNTER — Ambulatory Visit (INDEPENDENT_AMBULATORY_CARE_PROVIDER_SITE_OTHER): Payer: 59 | Admitting: Obstetrics and Gynecology

## 2016-11-16 ENCOUNTER — Encounter: Payer: Self-pay | Admitting: Obstetrics and Gynecology

## 2016-11-16 VITALS — BP 131/82 | HR 79 | Ht 62.0 in | Wt 149.5 lb

## 2016-11-16 DIAGNOSIS — L739 Follicular disorder, unspecified: Secondary | ICD-10-CM | POA: Diagnosis not present

## 2016-11-16 MED ORDER — OXYCODONE-ACETAMINOPHEN 5-325 MG PO TABS: 1.0000 | ORAL_TABLET | Freq: Four times a day (QID) | ORAL | 0 refills | Status: DC | PRN

## 2016-11-16 NOTE — Patient Instructions (Signed)
Folliculitis Folliculitis is inflammation of the hair follicles. Folliculitis most commonly occurs on the scalp, thighs, legs, back, and buttocks. However, it can occur anywhere on the body. What are the causes? This condition may be caused by:  A bacterial infection (common).  A fungal infection.  A viral infection.  Coming into contact with certain chemicals, especially oils and tars.  Shaving or waxing.  Applying greasy ointments or creams to your skin often. Long-lasting folliculitis and folliculitis that keeps coming back can be caused by bacteria that live in the nostrils. What increases the risk? This condition is more likely to develop in people with:  A weakened immune system.  Diabetes.  Obesity. What are the signs or symptoms? Symptoms of this condition include:  Redness.  Soreness.  Swelling.  Itching.  Small white or yellow, pus-filled, itchy spots (pustules) that appear over a reddened area. If there is an infection that goes deep into the follicle, these may develop into a boil (furuncle).  A group of closely packed boils (carbuncle). These tend to form in hairy, sweaty areas of the body. How is this diagnosed? This condition is diagnosed with a skin exam. To find what is causing the condition, your health care provider may take a sample of one of the pustules or boils for testing. How is this treated? This condition may be treated by:  Applying warm compresses to the affected areas.  Taking an antibiotic medicine or applying an antibiotic medicine to the skin.  Applying or bathing with an antiseptic solution.  Taking an over-the-counter medicine to help with itching.  Having a procedure to drain any pustules or boils. This may be done if a pustule or boil contains a lot of pus or fluid.  Laser hair removal. This may be done to treat long-lasting folliculitis. Follow these instructions at home:  If directed, apply heat to the affected area as  often as told by your health care provider. Use the heat source that your health care provider recommends, such as a moist heat pack or a heating pad.  Place a towel between your skin and the heat source.  Leave the heat on for 20-30 minutes.  Remove the heat if your skin turns bright red. This is especially important if you are unable to feel pain, heat, or cold. You may have a greater risk of getting burned.  If you were prescribed an antibiotic medicine, use it as told by your health care provider. Do not stop using the antibiotic even if you start to feel better.  Take over-the-counter and prescription medicines only as told by your health care provider.  Do not shave irritated skin.  Keep all follow-up visits as told by your health care provider. This is important. Get help right away if:  You have more redness, swelling, or pain in the affected area.  Red streaks are spreading from the affected area.  You have a fever. This information is not intended to replace advice given to you by your health care provider. Make sure you discuss any questions you have with your health care provider. Document Released: 10/11/2001 Document Revised: 02/20/2016 Document Reviewed: 05/23/2015 Elsevier Interactive Patient Education  2017 [employer].

## 2016-11-16 NOTE — Progress Notes (Signed)
Subjective:     Patient ID: Tracy Mason, female   DOB: 29-Jul-1976, 41 y.o.   MRN: [id number]  HPI Seen and treated for a boil a few weeks  And it went away with treatment, but more keep showing up. New areas not painful, just tender and feels it when she wipes. Epsom salt and warm compresses helps. Does report shaving bikini area every other day. Symptoms worse after shaving  Also needs refill on pain meds for dysmenorrhea, takes 3-6 tablets with each menses.  Review of Systems Negative except stated above in HPI    Objective:   Physical Exam A&O x4 Well groomed female in no distress Blood pressure 131/82, pulse 79, height 5' 2"  (1.575 m), weight 149 lb 8 oz (67.8 kg), last menstrual period 10/19/2016. Pelvic exam: normal external genitalia, vulva, vagina, cervix, uterus and adnexa, VULVA: normal appearing vulva with no masses, tenderness or lesions, resolving areas of filliculitis noted on right labia..    Assessment:     Folliculitis of groin secondary to shaving Dysmenorrhea      Plan:     Encouraged limiting shaving, epsom salt soaks OK as needed, and to add vagisil externally as needed. Pain med refilled.  Hila Bolding [place], CNM

## 2016-11-29 ENCOUNTER — Encounter: Payer: Self-pay | Admitting: Obstetrics and Gynecology

## 2016-11-30 ENCOUNTER — Other Ambulatory Visit: Payer: Self-pay | Admitting: *Deleted

## 2016-11-30 DIAGNOSIS — E663 Overweight: Secondary | ICD-10-CM

## 2016-11-30 MED ORDER — PHENTERMINE HCL 37.5 MG PO TABS: 37.5000 mg | ORAL_TABLET | Freq: Every day | ORAL | 2 refills | Status: DC

## 2017-02-07 ENCOUNTER — Other Ambulatory Visit: Payer: Self-pay | Admitting: *Deleted

## 2017-02-07 MED ORDER — HYDROCHLOROTHIAZIDE 25 MG PO TABS: 25.0000 mg | ORAL_TABLET | Freq: Every day | ORAL | 6 refills | Status: DC

## 2017-02-11 ENCOUNTER — Encounter: Payer: Self-pay | Admitting: Obstetrics and Gynecology

## 2017-02-23 ENCOUNTER — Encounter: Payer: Self-pay | Admitting: Obstetrics and Gynecology

## 2017-02-24 ENCOUNTER — Other Ambulatory Visit: Payer: Self-pay | Admitting: Obstetrics and Gynecology

## 2017-02-24 DIAGNOSIS — E663 Overweight: Secondary | ICD-10-CM

## 2017-02-24 MED ORDER — PHENTERMINE HCL 37.5 MG PO TABS: 37.5000 mg | ORAL_TABLET | Freq: Every day | ORAL | 2 refills | Status: DC

## 2017-02-24 MED ORDER — OXYCODONE-ACETAMINOPHEN 5-325 MG PO TABS: 1.0000 | ORAL_TABLET | Freq: Four times a day (QID) | ORAL | 0 refills | Status: DC | PRN

## 2017-03-07 ENCOUNTER — Other Ambulatory Visit: Payer: 59

## 2017-03-07 ENCOUNTER — Other Ambulatory Visit: Payer: Self-pay | Admitting: *Deleted

## 2017-03-07 DIAGNOSIS — N926 Irregular menstruation, unspecified: Secondary | ICD-10-CM

## 2017-03-08 ENCOUNTER — Other Ambulatory Visit: Payer: Self-pay | Admitting: Obstetrics and Gynecology

## 2017-03-08 ENCOUNTER — Ambulatory Visit (INDEPENDENT_AMBULATORY_CARE_PROVIDER_SITE_OTHER): Payer: 59

## 2017-03-08 ENCOUNTER — Telehealth: Payer: Self-pay | Admitting: *Deleted

## 2017-03-08 ENCOUNTER — Encounter: Payer: Self-pay | Admitting: Obstetrics and Gynecology

## 2017-03-08 ENCOUNTER — Other Ambulatory Visit: Payer: Self-pay | Admitting: *Deleted

## 2017-03-08 DIAGNOSIS — Z3687 Encounter for antenatal screening for uncertain dates: Secondary | ICD-10-CM

## 2017-03-08 LAB — BETA HCG QUANT (REF LAB): HCG QUANT: 52062 m[IU]/mL

## 2017-03-08 NOTE — Telephone Encounter (Signed)
Notified pt. 

## 2017-03-08 NOTE — Telephone Encounter (Signed)
-----   Message from Joylene Igo, [place] sent at 03/08/2017  8:35 AM EDT ----- Please add viability scan to upcoming appt

## 2017-03-09 ENCOUNTER — Encounter: Payer: 59 | Admitting: Obstetrics and Gynecology

## 2017-03-22 ENCOUNTER — Ambulatory Visit: Payer: 59 | Admitting: Obstetrics and Gynecology

## 2017-03-22 VITALS — BP 133/71 | HR 88 | Ht 62.0 in | Wt 164.5 lb

## 2017-03-22 DIAGNOSIS — Z3401 Encounter for supervision of normal first pregnancy, first trimester: Secondary | ICD-10-CM

## 2017-03-22 NOTE — Progress Notes (Signed)
Tracy Mason presents for [place] interview visit. Pregnancy confirmation done here at [facility]  G- 2.  P-1 . Pregnancy education material explained and given. _No cats in the home. NOB labs ordered. Marland Kitchen HIV labs and Drug screen were explained optional and she did not decline. Drug screen ordere. PNV encouraged. Genetic screening options discussed. Genetic testing unsure  Pt may discuss with provider. Pt. To follow up with Melody [facility] CMN on Spet 4,2018 @ 9:00, will be 11__ weeks for NOB physical.  All questions answered.

## 2017-03-23 LAB — CBC WITH DIFFERENTIAL/PLATELET
BASOS: 0 %
Basophils Absolute: 0 10*3/uL (ref 0.0–0.2)
EOS (ABSOLUTE): 0.1 10*3/uL (ref 0.0–0.4)
Eos: 0 %
HEMATOCRIT: 33.3 % — AB (ref 34.0–46.6)
HEMOGLOBIN: 11.2 g/dL (ref 11.1–15.9)
IMMATURE GRANS (ABS): 0.1 10*3/uL (ref 0.0–0.1)
IMMATURE GRANULOCYTES: 1 %
LYMPHS: 20 %
Lymphocytes Absolute: 3.4 10*3/uL — ABNORMAL HIGH (ref 0.7–3.1)
MCH: 29.6 pg (ref 26.6–33.0)
MCHC: 33.6 g/dL (ref 31.5–35.7)
MCV: 88 fL (ref 79–97)
Monocytes Absolute: 1.1 10*3/uL — ABNORMAL HIGH (ref 0.1–0.9)
Monocytes: 7 %
NEUTROS PCT: 72 %
Neutrophils Absolute: 12 10*3/uL — ABNORMAL HIGH (ref 1.4–7.0)
Platelets: 237 10*3/uL (ref 150–379)
RBC: 3.78 x10E6/uL (ref 3.77–5.28)
RDW: 13.5 % (ref 12.3–15.4)
WBC: 16.6 10*3/uL — AB (ref 3.4–10.8)

## 2017-03-23 LAB — HEPATITIS B SURFACE ANTIGEN: HEP B S AG: NEGATIVE

## 2017-03-23 LAB — MONITOR DRUG PROFILE 14(MW)
AMPHETAMINE SCREEN URINE: NEGATIVE ng/mL
BARBITURATE SCREEN URINE: NEGATIVE ng/mL
BENZODIAZEPINE SCREEN, URINE: NEGATIVE ng/mL
BUPRENORPHINE, URINE: NEGATIVE ng/mL
CANNABINOIDS UR QL SCN: NEGATIVE ng/mL
COCAINE(METAB.)SCREEN, URINE: NEGATIVE ng/mL
Creatinine(Crt), U: 148 mg/dL (ref 20.0–300.0)
Fentanyl, Urine: NEGATIVE pg/mL
MEPERIDINE SCREEN, URINE: NEGATIVE ng/mL
METHADONE SCREEN, URINE: NEGATIVE ng/mL
OPIATE SCREEN URINE: NEGATIVE ng/mL
OXYCODONE+OXYMORPHONE UR QL SCN: NEGATIVE ng/mL
PROPOXYPHENE SCREEN URINE: NEGATIVE ng/mL
Ph of Urine: 5.5 (ref 4.5–8.9)
Phencyclidine Qn, Ur: NEGATIVE ng/mL
SPECIFIC GRAVITY: 1.023
Tramadol Screen, Urine: NEGATIVE ng/mL

## 2017-03-23 LAB — URINALYSIS, ROUTINE W REFLEX MICROSCOPIC
Bilirubin, UA: NEGATIVE
GLUCOSE, UA: NEGATIVE
Ketones, UA: NEGATIVE
Leukocytes, UA: NEGATIVE
NITRITE UA: NEGATIVE
PH UA: 5.5 (ref 5.0–7.5)
PROTEIN UA: NEGATIVE
RBC, UA: NEGATIVE
Specific Gravity, UA: >=1.03 — AB (ref 1.005–1.030)
Urobilinogen, Ur: 0.2 mg/dL (ref 0.2–1.0)

## 2017-03-23 LAB — ABO AND RH: RH TYPE: POSITIVE

## 2017-03-23 LAB — RPR: RPR: NONREACTIVE

## 2017-03-23 LAB — ANTIBODY SCREEN: ANTIBODY SCREEN: NEGATIVE

## 2017-03-23 LAB — GC/CHLAMYDIA PROBE AMP
Chlamydia trachomatis, NAA: NEGATIVE
NEISSERIA GONORRHOEAE BY PCR: NEGATIVE

## 2017-03-23 LAB — RUBELLA SCREEN: Rubella Antibodies, IGG: 1.76 index (ref >0.99)

## 2017-03-23 LAB — HIV ANTIBODY (ROUTINE TESTING W REFLEX): HIV SCREEN 4TH GENERATION: NONREACTIVE

## 2017-03-23 LAB — VARICELLA ZOSTER ANTIBODY, IGG: Varicella zoster IgG: 1604 index (ref >165)

## 2017-03-24 ENCOUNTER — Ambulatory Visit: Payer: 59 | Admitting: Obstetrics and Gynecology

## 2017-03-24 ENCOUNTER — Encounter: Payer: Self-pay | Admitting: Obstetrics and Gynecology

## 2017-03-24 LAB — URINE CULTURE

## 2017-03-30 ENCOUNTER — Other Ambulatory Visit: Payer: Self-pay | Admitting: *Deleted

## 2017-03-30 ENCOUNTER — Encounter: Payer: Self-pay | Admitting: Obstetrics and Gynecology

## 2017-03-30 MED ORDER — PROMETHAZINE HCL 25 MG PO TABS: 25.0000 mg | ORAL_TABLET | Freq: Four times a day (QID) | ORAL | 1 refills | Status: DC | PRN

## 2017-03-30 MED ORDER — ONDANSETRON HCL 4 MG PO TABS: 4.0000 mg | ORAL_TABLET | Freq: Three times a day (TID) | ORAL | 3 refills | Status: DC | PRN

## 2017-04-19 ENCOUNTER — Ambulatory Visit (INDEPENDENT_AMBULATORY_CARE_PROVIDER_SITE_OTHER): Payer: 59 | Admitting: Obstetrics and Gynecology

## 2017-04-19 ENCOUNTER — Other Ambulatory Visit (INDEPENDENT_AMBULATORY_CARE_PROVIDER_SITE_OTHER): Payer: 59

## 2017-04-19 ENCOUNTER — Telehealth: Payer: Self-pay | Admitting: Obstetrics and Gynecology

## 2017-04-19 ENCOUNTER — Encounter: Payer: 59 | Admitting: Obstetrics and Gynecology

## 2017-04-19 ENCOUNTER — Encounter: Payer: Self-pay | Admitting: Obstetrics and Gynecology

## 2017-04-19 VITALS — BP 148/106 | HR 88 | Wt 173.7 lb

## 2017-04-19 DIAGNOSIS — Z3401 Encounter for supervision of normal first pregnancy, first trimester: Secondary | ICD-10-CM | POA: Diagnosis not present

## 2017-04-19 DIAGNOSIS — O10919 Unspecified pre-existing hypertension complicating pregnancy, unspecified trimester: Secondary | ICD-10-CM

## 2017-04-19 DIAGNOSIS — O09521 Supervision of elderly multigravida, first trimester: Secondary | ICD-10-CM | POA: Diagnosis not present

## 2017-04-19 DIAGNOSIS — O021 Missed abortion: Secondary | ICD-10-CM

## 2017-04-19 LAB — POCT URINALYSIS DIPSTICK
BILIRUBIN UA: NEGATIVE
Blood, UA: NEGATIVE
GLUCOSE UA: NEGATIVE
Ketones, UA: NEGATIVE
Leukocytes, UA: NEGATIVE
NITRITE UA: NEGATIVE
PROTEIN UA: NEGATIVE
Spec Grav, UA: 1.01 (ref 1.010–1.025)
UROBILINOGEN UA: 0.2 U/dL
pH, UA: 6 (ref 5.0–8.0)

## 2017-04-19 NOTE — Patient Instructions (Addendum)

## 2017-04-19 NOTE — Progress Notes (Signed)
NEW OB HISTORY AND PHYSICAL  SUBJECTIVE:       JOCI DRESS is a 41 y.o. G2P0 female, Patient's last menstrual period was 01/31/2017., Estimated Date of Delivery: 11/07/17, [redacted]w[redacted]d presents today for establishment of Prenatal Care. She has no unusual complaints and complains of headache      Gynecologic History Patient's last menstrual period was 01/31/2017. Unknown Contraception: none  Obstetric History OB History  Gravida Para Term Preterm AB Living  2         1  SAB TAB Ectopic Multiple Live Births          1    # Outcome Date GA Lbr Len/2nd Weight Sex Delivery Anes PTL Lv  2 Current           1 Gravida 2006    F Vag-Spont   LIV      Past Medical History:  Diagnosis Date  . Hypertension   . Kidney stones   . Nephrolithiasis    Approximately 20-25 episodes  . Ovarian cyst   . Pelvic pain in female   . Sleep apnea     Past Surgical History:  Procedure Laterality Date  . APPENDECTOMY  1988  . BREAST SURGERY Right 1993   benign biopsy  . CHOLECYSTECTOMY  2001  . UTERINE FIBROID SURGERY  2014   benign    Current Outpatient Prescriptions on File Prior to Visit  Medication Sig Dispense Refill  . ibuprofen (ADVIL,MOTRIN) 800 MG tablet     . promethazine (PHENERGAN) 25 MG tablet Take 1 tablet (25 mg total) by mouth every 6 (six) hours as needed for nausea or vomiting. 30 tablet 1  . hydrochlorothiazide (HYDRODIURIL) 25 MG tablet Take 1 tablet (25 mg total) by mouth daily. (Patient not taking: Reported on 03/22/2017) 90 tablet 6  . ondansetron (ZOFRAN) 4 MG tablet Take 1 tablet (4 mg total) by mouth every 8 (eight) hours as needed for nausea or vomiting. (Patient not taking: Reported on 04/19/2017) 20 tablet 3   No current facility-administered medications on file prior to visit.     Allergies  Allergen Reactions  . Biaxin [Clarithromycin] Hives    Social History   Social History  . Marital status: Married    Spouse name: TSherren Mocha . Number of children: N/A  .  Years of education: 154  Occupational History  . [employer]  Social History Main Topics  . Smoking status: Former Smoker    Years: 10.00    Quit date: 05/16/2004  . Smokeless tobacco: Never Used  . Alcohol use No  . Drug use: No  . Sexual activity: Yes   Other Topics Concern  . 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 She also is very active in volunteering at her daughter's school. She enjoys shopping, reading and going to the beach. They have two dogs, Jax and Cash.    Family History  Problem Relation Age of Onset  . Diabetes Mother   . Heart disease Father   . Diabetes Maternal Grandmother     The following portions of the patient's history were reviewed and updated as appropriate: allergies, current medications, past OB history, past medical history, past surgical  history, past family history, past social history, and problem list.    OBJECTIVE: Initial Physical Exam (New OB)  GENERAL APPEARANCE: alert, well appearing, in no apparent distress, oriented to person, place and time HEAD: normocephalic, atraumatic MOUTH: mucous membranes moist, pharynx normal without lesions and dental hygiene good HEART: regular rate and rhythm, no murmurs SKIN: normal coloration and turgor, no rashes LYMPH NODES: no adenopathy palpable NEUROLOGIC: alert, oriented, normal speech, no focal findings or movement disorder noted  PELVIC EXAM not indicated  ASSESSMENT: Missed abortion  PLAN: Counseled on expectant management vs D&C- desires D&C with BTL- Dr Marcelline Mates will take over care. See orders

## 2017-04-19 NOTE — Progress Notes (Signed)
Indications:Threatened AB Findings:  Singleton intrauterine pregnancy is visualized with a CRL consistent with 9 3/[redacted] weeks gestation, giving an (U/S) EDD of 11/19/17. The (U/S) EDD is NOT consistent with the clinically established (LMP) EDD of 11/07/17.  FHR: no heart motion detected. CRL measurement: 26.1 mm Yolk sac and and early anatomy is normal.  Right Ovary measures 3.1 x 1.9 x 2.4 cm. It is normal in appearance. Left Ovary measures 3.3 x 1.9 x 1.7 cm. It is normal appearance. Survey of the adnexa demonstrates no adnexal masses. There is no free peritoneal fluid in the cul de sac.  Impression: 1. 9 3/7 week non Viable Singleton Intrauterine pregnancy by U/S. 2. (U/S) EDD is NOT consistent with Clinically established (LMP) EDD of 11/07/17.  Recommendations: 1.Clinical correlation with the patient's History and Physical Exam.   Tracy Mason   Scan reviewed and discussed with patient and spouse and Dr Lowanda Foster, CNM

## 2017-04-19 NOTE — Telephone Encounter (Signed)
Patient called and stated that she was expecting a call back from Melody and wanted to know if any medication could be prescribed for the cramping and discomfort she is having due to her having a miscarriage. The patient did not disclose any other information. Please advise.

## 2017-04-19 NOTE — Progress Notes (Signed)
NOB physical- pt is doing well, denies any headaches

## 2017-04-20 ENCOUNTER — Encounter
Admission: RE | Admit: 2017-04-20 | Discharge: 2017-04-20 | Disposition: A | Discharge status: Home / Self Care | Payer: 59 | Source: Ambulatory Visit | Attending: Obstetrics and Gynecology | Admitting: Obstetrics and Gynecology

## 2017-04-20 DIAGNOSIS — O021 Missed abortion: Secondary | ICD-10-CM | POA: Diagnosis not present

## 2017-04-20 DIAGNOSIS — Z6831 Body mass index (BMI) 31.0-31.9, adult: Secondary | ICD-10-CM | POA: Diagnosis not present

## 2017-04-20 DIAGNOSIS — Z87442 Personal history of urinary calculi: Secondary | ICD-10-CM | POA: Diagnosis not present

## 2017-04-20 DIAGNOSIS — Z87891 Personal history of nicotine dependence: Secondary | ICD-10-CM | POA: Diagnosis not present

## 2017-04-20 DIAGNOSIS — M2629 Other anomalies of dental arch relationship: Secondary | ICD-10-CM | POA: Diagnosis not present

## 2017-04-20 DIAGNOSIS — Z888 Allergy status to other drugs, medicaments and biological substances status: Secondary | ICD-10-CM | POA: Diagnosis not present

## 2017-04-20 DIAGNOSIS — Z9989 Dependence on other enabling machines and devices: Secondary | ICD-10-CM | POA: Diagnosis not present

## 2017-04-20 DIAGNOSIS — Z302 Encounter for sterilization: Secondary | ICD-10-CM | POA: Diagnosis not present

## 2017-04-20 DIAGNOSIS — E669 Obesity, unspecified: Secondary | ICD-10-CM | POA: Diagnosis not present

## 2017-04-20 DIAGNOSIS — I10 Essential (primary) hypertension: Secondary | ICD-10-CM | POA: Diagnosis not present

## 2017-04-20 DIAGNOSIS — G4730 Sleep apnea, unspecified: Secondary | ICD-10-CM | POA: Diagnosis not present

## 2017-04-20 DIAGNOSIS — Z79899 Other long term (current) drug therapy: Secondary | ICD-10-CM | POA: Diagnosis not present

## 2017-04-20 LAB — CBC
HCT: 37.3 % (ref 35.0–47.0)
HEMOGLOBIN: 12.9 g/dL (ref 12.0–16.0)
MCH: 29.8 pg (ref 26.0–34.0)
MCHC: 34.5 g/dL (ref 32.0–36.0)
MCV: 86.4 fL (ref 80.0–100.0)
PLATELETS: 301 10*3/uL (ref 150–440)
RBC: 4.32 MIL/uL (ref 3.80–5.20)
RDW: 13.1 % (ref 11.5–14.5)
WBC: 11.6 10*3/uL — AB (ref 3.6–11.0)

## 2017-04-20 LAB — TYPE AND SCREEN
ABO/RH(D): A POS
ANTIBODY SCREEN: NEGATIVE
EXTEND SAMPLE REASON: UNDETERMINED

## 2017-04-20 MED ORDER — DEXTROSE 5 % IV SOLN
200.0000 mg | Freq: Once | INTRAVENOUS | Status: AC
  Administered 2017-04-21: 200 mg via INTRAVENOUS
  Filled 2017-04-20: qty 200

## 2017-04-20 NOTE — Patient Instructions (Addendum)
Your procedure is scheduled on: 04/21/17 Thurs Report to Same Day Surgery 2nd floor medical mall [facility] Entrance-take elevator on left to 2nd floor.  Check in with surgery information desk.) Arrival time 12:00 noon Remember: Instructions that are not followed completely may result in serious medical risk, up to and including death, or upon the discretion of your surgeon and anesthesiologist your surgery may need to be rescheduled.    _x___ 1. Do not eat food after midnight the night before your procedure. You may drink clear liquids up to 2 hours before you are scheduled to arrive at the hospital for your procedure.  Do not drink clear liquids within 2 hours of your scheduled arrival to the hospital.  Clear liquids include  --Water or Apple juice without pulp  --Clear carbohydrate beverage such as ClearFast or Gatorade  --Black Coffee or Clear Tea (No milk, no creamers, do not add anything to                  the coffee or Tea Type 1 and type 2 diabetics should only drink water.  No gum chewing or hard candies.     __x__ 2. No Alcohol for 24 hours before or after surgery.   __x__3. No Smoking for 24 prior to surgery.   ____  4. Bring all medications with you on the day of surgery if instructed.    __x__ 5. Notify your doctor if there is any change in your medical condition     (cold, fever, infections).     Do not wear jewelry, make-up, hairpins, clips or nail polish.  Do not wear lotions, powders, or perfumes. You may wear deodorant.  Do not shave 48 hours prior to surgery. Men may shave face and neck.  Do not bring valuables to the hospital.    [facility] is not responsible for any belongings or valuables.               Contacts, dentures or bridgework may not be worn into surgery.  Leave your suitcase in the car. After surgery it may be brought to your room.  For patients admitted to the hospital, discharge time is determined by your                       treatment  team.   Patients discharged the day of surgery will not be allowed to drive home.  You will need someone to drive you home and stay with you the night of your procedure.    Please read over the following fact sheets that you were given:   [facility] Preparing for Surgery and or MRSA Information   _x___ Take anti-hypertensive (unless it includes a diuretic), cardiac, seizure, asthma,     anti-reflux and psychiatric medicines. These include:  1. HYDROcodone-acetaminophen (NORCO/VICODIN) 5-325 MG  If needed   2.  3.  4.  5.  6.  ____Fleets enema or Magnesium Citrate as directed.   ____ Use CHG Soap or sage wipes as directed on instruction sheet  x ____ Use inhalers on the day of surgery and bring to hospital day of surgery  ____ Stop Metformin and Janumet 2 days prior to surgery.    ____ Take 1/2 of usual insulin dose the night before surgery and none on the morning     surgery.   _x___ Follow recommendations from Cardiologist, Pulmonologist or PCP regarding          stopping Aspirin, Coumadin,  Plavix ,Eliquis, Effient, or Pradaxa, and Pletal.  X____Stop Anti-inflammatories such as Advil, Aleve, Ibuprofen, Motrin, Naproxen, Naprosyn, Goodies powders or aspirin products. OK to take Tylenol and                          Celebrex.   _x___ Stop supplements until after surgery.  But may continue Vitamin D, Vitamin B,       and multivitamin.   ____ Bring C-Pap to the hospital.

## 2017-04-20 NOTE — H&P (Signed)
GYNECOLOGY PRE-OPERATIVE HISTORY AND PHYSICAL Subjective:    Patient is a 41 y.o. G75P1001 female scheduled for Suction D&C and laparoscopic bilateral tubal ligation. Indications for procedure are missed abortion at [redacted] weeks gestation and multiparity desiring permanent sterilization.  Patient's last menstrual period was 01/31/2017.    Discussed Blood/Blood Products: no    OB History    Gravida Para Term Preterm AB Living   2 1 1     1    SAB TAB Ectopic Multiple Live Births           1      Obstetric Comments   Gestational diabetes, PIH with severe features, IOL at 37 weeks, with now chronic HTN.       OB History  Gravida Para Term Preterm AB Living  2 1 1     1   SAB TAB Ectopic Multiple Live Births          1    # Outcome Date GA Lbr Len/2nd Weight Sex Delivery Anes PTL Lv  2 Current           1 Term 2006 [redacted]w[redacted]d 7 lb 11 oz (3.487 kg) F Vag-Spont EPI N LIV    Obstetric Comments  Gestational diabetes, PIH with severe features, IOL at 37 weeks, with now chronic HTN.     Past Medical History:  Diagnosis Date  . Hypertension   . Kidney stones   . Nephrolithiasis    Approximately 20-25 episodes  . Ovarian cyst   . Pelvic pain in female   . Sleep apnea     Past Surgical History:  Procedure Laterality Date  . APPENDECTOMY  1988  . BREAST SURGERY Right 1993   benign biopsy  . CHOLECYSTECTOMY  2001  . UTERINE FIBROID SURGERY  2014   benign    Family History  Problem Relation Age of Onset  . Diabetes Mother   . Heart disease Father   . Diabetes Maternal Grandmother      Outpatient Encounter Prescriptions as of 04/19/2017  Medication Sig  . [DISCONTINUED] aspirin 81 MG chewable tablet Chew by mouth daily.  . [DISCONTINUED] folic acid (FOLVITE) 1 MG tablet Take 1 mg by mouth daily.  . [DISCONTINUED] ibuprofen (ADVIL,MOTRIN) 800 MG tablet   . [DISCONTINUED] promethazine (PHENERGAN) 25 MG tablet Take 1 tablet (25 mg total) by mouth every 6 (six) hours as  needed for nausea or vomiting.  . [DISCONTINUED] hydrochlorothiazide (HYDRODIURIL) 25 MG tablet Take 1 tablet (25 mg total) by mouth daily. (Patient not taking: Reported on 03/22/2017)  . [DISCONTINUED] ondansetron (ZOFRAN) 4 MG tablet Take 1 tablet (4 mg total) by mouth every 8 (eight) hours as needed for nausea or vomiting. (Patient not taking: Reported on 04/19/2017)   No facility-administered encounter medications on file as of 04/19/2017.      Allergies  Allergen Reactions  . Biaxin [Clarithromycin] Hives    Review of Systems Constitutional: No recent fever/chills/sweats Respiratory: No recent cough/bronchitis Cardiovascular: No chest pain Gastrointestinal: No recent nausea/vomiting/diarrhea Genitourinary: No UTI symptoms Hematologic/lymphatic:No history of coagulopathy or recent blood thinner use    Objective:    BP (!) 148/106   Pulse 88   Wt 173 lb 11.2 oz (78.8 kg)   LMP 01/31/2017   BMI 31.77 kg/m   General:   Normal  Skin:   normal  HEENT:  Normal  Neck:  Supple without Adenopathy or Thyromegaly  Lungs:   Heart:  Breasts:   Abdomen:  Pelvis:  M/S   Extremeties:  Neuro:    clear to auscultation bilaterally   Normal without murmur   Not Examined   soft, non-tender; bowel sounds normal; no masses,  no organomegaly   Exam deferred to OR  No CVAT  Warm/Dry   Normal         Labs:  Lab Results  Component Value Date   ABORH A POS 04/20/2017    Imaging:  ULTRASOUND REPORT  Location: [facility] Date of Service: 04/19/17  Indications:Threatened AB Findings:  Singleton intrauterine pregnancy is visualized with a CRL consistent with 9 3/[redacted] weeks gestation, giving an (U/S) EDD of 11/19/17. The (U/S) EDD is NOT consistent with the clinically established (LMP) EDD of 11/07/17.  FHR: no heart motion detected. CRL measurement: 26.1 mm Yolk sac and and early anatomy is normal.  Right Ovary measures 3.1 x 1.9 x 2.4 cm. It is normal  in appearance. Left Ovary measures 3.3 x 1.9 x 1.7 cm. It is normal appearance. Survey of the adnexa demonstrates no adnexal masses. There is no free peritoneal fluid in the cul de sac.  Impression: 1. 9 3/7 week non Viable Singleton Intrauterine pregnancy by U/S. 2. (U/S) EDD is NOT consistent with Clinically established (LMP) EDD of 11/07/17.  Recommendations: 1.Clinical correlation with the patient's History and Physical Exam.   Tyrone Apple  Assessment:     Missed abortion  Advanced maternal age Multiparity desiring permanent sterilization   Plan:   - Counseling: Procedure, risks, reasons, benefits and complications (including injury to bowel, bladder, major blood vessel, ureter, bleeding, possibility of transfusion, infection, or fistula formation) reviewed in detail.  - Preop testing ordered. - Patient desires permanent sterilization.  Other reversible forms of contraception were discussed with patient; she declines all other modalities. Risks of procedure discussed with patient including but not limited to: risk of regret, permanence of method, bleeding, infection, injury to surrounding organs and need for additional procedures.  Failure risk of 1-2 % with increased risk of ectopic gestation if pregnancy occurs was also discussed with patient.  Patient verbalized understanding of these risks and wants to proceed with sterilization.  - Instructions reviewed, including NPO after midnight.   Rubie Maid, MD [facility]

## 2017-04-20 NOTE — H&P (Deleted)
  The note originally documented on this encounter has been moved the the encounter in which it belongs.  

## 2017-04-20 NOTE — Progress Notes (Signed)
    GYNECOLOGY PROGRESS NOTE  Subjective:    Patient ID: Tracy Mason, female    DOB: 1976-07-14, 41 y.o.   MRN: [id number]  HPI  Patient is a 41 y.o. G61P1001 female who presents for consultation for surgery. Patient was seen by Lorelle Gibbs, CNM today in clinic and was noted to have a missed abortion at [redacted] weeks gestation.  Options were discussed with patient, including expectant management, medical management with Cytotec, and surgical management with D&C.  Patient also desires to discuss the possibility of having a tubal ligation at the time of her surgery.  Notes that this pregnancy was a surprise, and does not desire future fertility.   The following portions of the patient's history were reviewed and updated as appropriate: allergies, current medications, past family history, past medical history, past social history, past surgical history and problem list.  Review of Systems A comprehensive review of systems was negative except for: Genitourinary: positive for light vaginal spotting   Objective:   Blood pressure (!) 148/106, pulse 88, weight 173 lb 11.2 oz (78.8 kg), last menstrual period 01/31/2017. General appearance: alert and no distress Abdomen: soft, non-tender; bowel sounds normal; no masses,  no organomegaly Pelvic: deferred   Imaging:  Ultrasound reviewed, noting missed Ab at [redacted] weeks gestation.   Assessment:   Missed abortion at 42 weeks Advanced maternal age Multiparity, desiring permanent sterilization Chronic HTN Plan:   1. Missed abortion - patient desires surgical management with D&C.  Discussed risks vs benefits of procedure, details of the procedure, including post-operative expectations.  Patient notes understanding.  Will schedule in 1-2 days, based on OR availability.  2. Advanced maternal age and multiparity, desiring permanent sterilization - Patient notes that she would like to have her tubes tied, as she is done with childbearing.  Was initially on  OCPs when pregnancy occurred. Other reversible forms of contraception were discussed with patient; she declines all other modalities. Risks of procedure discussed with patient including but not limited to: risk of regret, permanence of method, bleeding, infection, injury to surrounding organs and need for additional procedures.  Failure risk of 1-2 % with increased risk of ectopic gestation if pregnancy occurs was also discussed with patient.  Patient verbalized understanding of these risks and wants to proceed with sterilization.   3. CHTN - patient notes that she stopped BP meds at discovery of the pregnancy as she knew that she would need to have her medications changed for pregnancy.  Advised that she can now resume her previous meds.    A total of 15 minutes were spent face-to-face with the patient during this encounter and over half of that time dealt with counseling and coordination of care.   Rubie Maid, MD [facility]

## 2017-04-20 NOTE — Addendum Note (Signed)
Addended by: Augusto Gamble on: 04/20/2017 12:36 PM   Modules accepted: Orders

## 2017-04-21 ENCOUNTER — Ambulatory Visit: Payer: 59 | Admitting: Certified Registered"

## 2017-04-21 ENCOUNTER — Encounter
Admission: RE | Disposition: A | Discharge status: Home / Self Care | Payer: Self-pay | Source: Ambulatory Visit | Attending: Obstetrics and Gynecology

## 2017-04-21 ENCOUNTER — Ambulatory Visit
Admission: RE | Admit: 2017-04-21 | Discharge: 2017-04-21 | Disposition: A | Discharge status: Home / Self Care | Payer: 59 | Source: Ambulatory Visit | Attending: Obstetrics and Gynecology | Admitting: Obstetrics and Gynecology

## 2017-04-21 DIAGNOSIS — I10 Essential (primary) hypertension: Secondary | ICD-10-CM | POA: Insufficient documentation

## 2017-04-21 DIAGNOSIS — O021 Missed abortion: Secondary | ICD-10-CM | POA: Diagnosis not present

## 2017-04-21 DIAGNOSIS — M2629 Other anomalies of dental arch relationship: Secondary | ICD-10-CM | POA: Insufficient documentation

## 2017-04-21 DIAGNOSIS — Z6831 Body mass index (BMI) 31.0-31.9, adult: Secondary | ICD-10-CM | POA: Insufficient documentation

## 2017-04-21 DIAGNOSIS — Z87442 Personal history of urinary calculi: Secondary | ICD-10-CM | POA: Insufficient documentation

## 2017-04-21 DIAGNOSIS — Z888 Allergy status to other drugs, medicaments and biological substances status: Secondary | ICD-10-CM | POA: Insufficient documentation

## 2017-04-21 DIAGNOSIS — Z9989 Dependence on other enabling machines and devices: Secondary | ICD-10-CM | POA: Insufficient documentation

## 2017-04-21 DIAGNOSIS — G4730 Sleep apnea, unspecified: Secondary | ICD-10-CM | POA: Diagnosis not present

## 2017-04-21 DIAGNOSIS — Z302 Encounter for sterilization: Secondary | ICD-10-CM | POA: Insufficient documentation

## 2017-04-21 DIAGNOSIS — Z79899 Other long term (current) drug therapy: Secondary | ICD-10-CM | POA: Insufficient documentation

## 2017-04-21 DIAGNOSIS — Z87891 Personal history of nicotine dependence: Secondary | ICD-10-CM | POA: Insufficient documentation

## 2017-04-21 DIAGNOSIS — E669 Obesity, unspecified: Secondary | ICD-10-CM | POA: Insufficient documentation

## 2017-04-21 HISTORY — PX: TUBAL LIGATION: SHX77

## 2017-04-21 HISTORY — PX: DILATION AND EVACUATION: SHX1459

## 2017-04-21 SURGERY — DILATION AND EVACUATION, UTERUS
Anesthesia: General | Wound class: Clean Contaminated

## 2017-04-21 MED ORDER — PROMETHAZINE HCL 25 MG/ML IJ SOLN
INTRAMUSCULAR | Status: AC
  Administered 2017-04-21: 6.25 mg via INTRAVENOUS
  Filled 2017-04-21: qty 1

## 2017-04-21 MED ORDER — ACETAMINOPHEN 10 MG/ML IV SOLN
INTRAVENOUS | Status: AC
  Filled 2017-04-21: qty 100

## 2017-04-21 MED ORDER — SODIUM CHLORIDE 0.9 % IJ SOLN
INTRAMUSCULAR | Status: AC
  Filled 2017-04-21: qty 10

## 2017-04-21 MED ORDER — MIDAZOLAM HCL 2 MG/2ML IJ SOLN
INTRAMUSCULAR | Status: AC
  Filled 2017-04-21: qty 2

## 2017-04-21 MED ORDER — SUGAMMADEX SODIUM 200 MG/2ML IV SOLN
INTRAVENOUS | Status: DC | PRN
  Administered 2017-04-21: 160 mg via INTRAVENOUS

## 2017-04-21 MED ORDER — FAMOTIDINE 20 MG PO TABS
20.0000 mg | ORAL_TABLET | Freq: Once | ORAL | Status: AC
  Administered 2017-04-21: 20 mg via ORAL

## 2017-04-21 MED ORDER — PROMETHAZINE HCL 25 MG/ML IJ SOLN
6.2500 mg | Freq: Once | INTRAMUSCULAR | Status: AC
  Administered 2017-04-21: 6.25 mg via INTRAVENOUS

## 2017-04-21 MED ORDER — ONDANSETRON HCL 4 MG/2ML IJ SOLN
INTRAMUSCULAR | Status: AC
  Filled 2017-04-21: qty 2

## 2017-04-21 MED ORDER — DEXAMETHASONE SODIUM PHOSPHATE 10 MG/ML IJ SOLN
INTRAMUSCULAR | Status: DC | PRN
  Administered 2017-04-21: 10 mg via INTRAVENOUS

## 2017-04-21 MED ORDER — LACTATED RINGERS IV SOLN
INTRAVENOUS | Status: DC
  Administered 2017-04-21: 15:00:00 via INTRAVENOUS

## 2017-04-21 MED ORDER — ROCURONIUM BROMIDE 100 MG/10ML IV SOLN
INTRAVENOUS | Status: DC | PRN
  Administered 2017-04-21 (×2): 10 mg via INTRAVENOUS
  Administered 2017-04-21: 40 mg via INTRAVENOUS

## 2017-04-21 MED ORDER — ACETAMINOPHEN 10 MG/ML IV SOLN
INTRAVENOUS | Status: DC | PRN
  Administered 2017-04-21: 1000 mg via INTRAVENOUS

## 2017-04-21 MED ORDER — HYDROCODONE-ACETAMINOPHEN 5-325 MG PO TABS
ORAL_TABLET | ORAL | Status: DC
Start: 2017-04-21 — End: 2017-04-21
  Filled 2017-04-21: qty 1

## 2017-04-21 MED ORDER — ONDANSETRON HCL 4 MG/2ML IJ SOLN
4.0000 mg | Freq: Once | INTRAMUSCULAR | Status: AC | PRN
  Administered 2017-04-21: 4 mg via INTRAVENOUS

## 2017-04-21 MED ORDER — LIDOCAINE HCL (PF) 2 % IJ SOLN
INTRAMUSCULAR | Status: AC
  Filled 2017-04-21: qty 2

## 2017-04-21 MED ORDER — FENTANYL CITRATE (PF) 100 MCG/2ML IJ SOLN
INTRAMUSCULAR | Status: AC
  Filled 2017-04-21: qty 2

## 2017-04-21 MED ORDER — BUPIVACAINE HCL (PF) 0.5 % IJ SOLN
INTRAMUSCULAR | Status: DC | PRN
  Administered 2017-04-21: 10 mL

## 2017-04-21 MED ORDER — PROPOFOL 10 MG/ML IV BOLUS
INTRAVENOUS | Status: DC | PRN
  Administered 2017-04-21: 200 mg via INTRAVENOUS

## 2017-04-21 MED ORDER — PROPOFOL 10 MG/ML IV BOLUS
INTRAVENOUS | Status: AC
  Filled 2017-04-21: qty 20

## 2017-04-21 MED ORDER — DEXAMETHASONE SODIUM PHOSPHATE 10 MG/ML IJ SOLN
INTRAMUSCULAR | Status: AC
  Filled 2017-04-21: qty 1

## 2017-04-21 MED ORDER — KETOROLAC TROMETHAMINE 30 MG/ML IJ SOLN
INTRAMUSCULAR | Status: DC | PRN
  Administered 2017-04-21: 30 mg via INTRAVENOUS

## 2017-04-21 MED ORDER — LACTATED RINGERS IV SOLN
INTRAVENOUS | Status: DC
  Administered 2017-04-21: 12:00:00 via INTRAVENOUS

## 2017-04-21 MED ORDER — FAMOTIDINE 20 MG PO TABS
ORAL_TABLET | ORAL | Status: AC
  Filled 2017-04-21: qty 1

## 2017-04-21 MED ORDER — HYDROMORPHONE HCL 1 MG/ML IJ SOLN
0.2500 mg | INTRAMUSCULAR | Status: DC | PRN
  Administered 2017-04-21 (×4): 0.25 mg via INTRAVENOUS

## 2017-04-21 MED ORDER — FENTANYL CITRATE (PF) 100 MCG/2ML IJ SOLN
INTRAMUSCULAR | Status: DC | PRN
  Administered 2017-04-21 (×4): 50 ug via INTRAVENOUS

## 2017-04-21 MED ORDER — MIDAZOLAM HCL 2 MG/2ML IJ SOLN
INTRAMUSCULAR | Status: DC | PRN
  Administered 2017-04-21: 2 mg via INTRAVENOUS

## 2017-04-21 MED ORDER — BUPIVACAINE HCL (PF) 0.5 % IJ SOLN
INTRAMUSCULAR | Status: AC
  Filled 2017-04-21: qty 30

## 2017-04-21 MED ORDER — GLYCOPYRROLATE 0.2 MG/ML IJ SOLN
INTRAMUSCULAR | Status: AC
  Filled 2017-04-21: qty 1

## 2017-04-21 MED ORDER — FENTANYL CITRATE (PF) 100 MCG/2ML IJ SOLN
25.0000 ug | INTRAMUSCULAR | Status: DC | PRN
  Administered 2017-04-21 (×4): 25 ug via INTRAVENOUS

## 2017-04-21 MED ORDER — KETOROLAC TROMETHAMINE 30 MG/ML IJ SOLN
INTRAMUSCULAR | Status: AC
  Filled 2017-04-21: qty 1

## 2017-04-21 MED ORDER — ROCURONIUM BROMIDE 50 MG/5ML IV SOLN
INTRAVENOUS | Status: AC
  Filled 2017-04-21: qty 1

## 2017-04-21 MED ORDER — IBUPROFEN 800 MG PO TABS: 800.0000 mg | ORAL_TABLET | Freq: Three times a day (TID) | ORAL | 1 refills | Status: DC | PRN

## 2017-04-21 MED ORDER — ONDANSETRON HCL 4 MG/2ML IJ SOLN
INTRAMUSCULAR | Status: DC | PRN
  Administered 2017-04-21: 4 mg via INTRAVENOUS

## 2017-04-21 MED ORDER — HYDROMORPHONE HCL 1 MG/ML IJ SOLN
INTRAMUSCULAR | Status: AC
  Administered 2017-04-21: 0.25 mg via INTRAVENOUS
  Filled 2017-04-21: qty 1

## 2017-04-21 MED ORDER — HYDROCODONE-ACETAMINOPHEN 5-325 MG PO TABS
1.0000 | ORAL_TABLET | Freq: Four times a day (QID) | ORAL | Status: DC | PRN
  Administered 2017-04-21: 1 via ORAL

## 2017-04-21 MED ORDER — PHENYLEPHRINE HCL 10 MG/ML IJ SOLN
INTRAMUSCULAR | Status: DC | PRN
  Administered 2017-04-21 (×2): 100 ug via INTRAVENOUS

## 2017-04-21 MED ORDER — SUGAMMADEX SODIUM 200 MG/2ML IV SOLN
INTRAVENOUS | Status: AC
  Filled 2017-04-21: qty 2

## 2017-04-21 MED ORDER — FENTANYL CITRATE (PF) 100 MCG/2ML IJ SOLN
INTRAMUSCULAR | Status: AC
  Administered 2017-04-21: 25 ug via INTRAVENOUS
  Filled 2017-04-21: qty 2

## 2017-04-21 MED ORDER — GLYCOPYRROLATE 0.2 MG/ML IJ SOLN
INTRAMUSCULAR | Status: DC | PRN
  Administered 2017-04-21: 0.2 mg via INTRAVENOUS

## 2017-04-21 MED ORDER — LIDOCAINE HCL (CARDIAC) 20 MG/ML IV SOLN
INTRAVENOUS | Status: DC | PRN
  Administered 2017-04-21: 50 mg via INTRAVENOUS

## 2017-04-21 SURGICAL SUPPLY — 39 items
CATH ROBINSON RED A/P 16FR (CATHETERS) ×3 IMPLANT
CHLORAPREP W/TINT 26ML (MISCELLANEOUS) ×3 IMPLANT
CLIP FILSHIE TUBAL LIGA STRL (Clip) ×6 IMPLANT
CORD MONOPOLAR M/FML 12FT (MISCELLANEOUS) ×3 IMPLANT
COVER MAYO STAND STRL (DRAPES) ×3 IMPLANT
DEFOGGER SCOPE WARMER CLEARIFY (MISCELLANEOUS) ×3 IMPLANT
DERMABOND ADVANCED (GAUZE/BANDAGES/DRESSINGS) ×1
DERMABOND ADVANCED .7 DNX12 (GAUZE/BANDAGES/DRESSINGS) ×2 IMPLANT
DRSG TELFA 3X8 NADH (GAUZE/BANDAGES/DRESSINGS) ×3 IMPLANT
FILTER UTR ASPR SPEC (MISCELLANEOUS) ×2 IMPLANT
FLTR UTR ASPR SPEC (MISCELLANEOUS) ×3
GLOVE BIO SURGEON STRL SZ 6.5 (GLOVE) ×6 IMPLANT
GLOVE BIOGEL PI IND STRL 6.5 (GLOVE) ×6 IMPLANT
GLOVE BIOGEL PI INDICATOR 6.5 (GLOVE) ×3
GLOVE INDICATOR 7.0 STRL GRN (GLOVE) ×3 IMPLANT
GOWN STRL REUS W/ TWL LRG LVL3 (GOWN DISPOSABLE) ×2 IMPLANT
GOWN STRL REUS W/TWL LRG LVL3 (GOWN DISPOSABLE) ×1
KIT BERKELEY 1ST TRIMESTER 3/8 (MISCELLANEOUS) ×3 IMPLANT
KIT RM TURNOVER STRD PROC AR (KITS) ×3 IMPLANT
MONOPOLAR LAPAROSCOPIC CONNECTING CORD 10 FT ×3 IMPLANT
NEEDLE HYPO 25X1 1.5 SAFETY (NEEDLE) ×3 IMPLANT
NEEDLE SPNL 25GX3.5 QUINCKE BL (NEEDLE) ×3 IMPLANT
PACK DNC HYST (MISCELLANEOUS) ×3 IMPLANT
PACK GYN LAPAROSCOPIC (MISCELLANEOUS) ×3 IMPLANT
PAD OB MATERNITY 4.3X12.25 (PERSONAL CARE ITEMS) ×3 IMPLANT
PAD PREP 24X41 OB/GYN DISP (PERSONAL CARE ITEMS) ×3 IMPLANT
SET BERKELEY SUCTION TUBING (SUCTIONS) ×3 IMPLANT
SLEEVE ENDOPATH XCEL 5M (ENDOMECHANICALS) ×3 IMPLANT
SOL PREP PVP 2OZ (MISCELLANEOUS) ×3
SOLUTION PREP PVP 2OZ (MISCELLANEOUS) ×2 IMPLANT
SPONGE XRAY 4X4 16PLY STRL (MISCELLANEOUS) ×3 IMPLANT
SYRINGE 10CC LL (SYRINGE) ×3 IMPLANT
TROCAR XCEL NON-BLD 5MMX100MML (ENDOMECHANICALS) ×6 IMPLANT
TROCAR XCEL UNIV SLVE 11M 100M (ENDOMECHANICALS) ×3 IMPLANT
VACURETTE 10 RIGID CVD (CANNULA) IMPLANT
VACURETTE 12 RIGID CVD (CANNULA) IMPLANT
VACURETTE 7MM F TIP (CANNULA) ×1
VACURETTE 7MM F TIP STRL (CANNULA) ×2 IMPLANT
VACURETTE 8 RIGID CVD (CANNULA) IMPLANT

## 2017-04-21 NOTE — Discharge Instructions (Signed)
AMBULATORY SURGERY  DISCHARGE INSTRUCTIONS   1) The drugs that you were given will stay in your system until tomorrow so for the next 24 hours you should not:  A) Drive an automobile B) Make any legal decisions C) Drink any alcoholic beverage   2) You may resume regular meals tomorrow.  Today it is better to start with liquids and gradually work up to solid foods.  You may eat anything you prefer, but it is better to start with liquids, then soup and crackers, and gradually work up to solid foods.   3) Please notify your doctor immediately if you have any unusual bleeding, trouble breathing, redness and pain at the surgery site, drainage, fever, or pain not relieved by medication. 4)   5) Your post-operative visit with Dr.                                     is: Date:                        Time:    Please call to schedule your post-operative visit.  6) Additional Instructions:     AMBULATORY SURGERY  DISCHARGE INSTRUCTIONS   7) The drugs that you were given will stay in your system until tomorrow so for the next 24 hours you should not:  D) Drive an automobile E) Make any legal decisions F) Drink any alcoholic beverage   8) You may resume regular meals tomorrow.  Today it is better to start with liquids and gradually work up to solid foods.  You may eat anything you prefer, but it is better to start with liquids, then soup and crackers, and gradually work up to solid foods.   9) Please notify your doctor immediately if you have any unusual bleeding, trouble breathing, redness and pain at the surgery site, drainage, fever, or pain not relieved by medication.    10) Additional Instructions:        Please contact your physician with any problems or Same Day Surgery at [phone], Monday through Friday 6 am to 4 pm, or [place] at [facility] number at [phone].

## 2017-04-21 NOTE — H&P (Signed)
UPDATE TO PREVIOUS HISTORY AND PHYSICAL  The patient has been seen and examined.  H&P is up to date, no changes noted. Tracy Mason is a 41 y.o. G18P1001 female scheduled for Suction D&C and laparoscopic bilateral tubal ligation. Indications for procedure are missed abortion at [redacted] weeks gestation and multiparity desiring permanent sterilization. All questions answered. Patient can proceed to the OR for scheduled procedure.   Rubie Maid, MD 04/21/2017 1:02 PM

## 2017-04-21 NOTE — Op Note (Signed)
Procedure(s): DILATATION AND EVACUATION BILATERAL TUBAL LIGATION Procedure Note  Tracy Mason female 41 y.o. 04/21/2017  Indications: The patient is a 41 y.o. G9P1001 female with missed abortion at [redacted] weeks gestation. Also desiring permanent sterilization.   Pre-operative Diagnosis: Missed abortion, Multiparity desiring permanent sterilization  Post-operative Diagnosis: Same, with extensive abdominal adhesions.   Surgeon: Rubie Maid, MD  Assistants: Surgical scrub tech  Anesthesia: General endotracheal anesthesia  Procedure Details: The patient was seen in the Holding Room. The risks, benefits, complications, treatment options, and expected outcomes were discussed with the patient.  The patient concurred with the proposed plan, giving informed consent.  The site of surgery properly noted/marked. The patient was taken to the Operating Room, identified as Tracy Mason and the procedure verified as Procedure(s) (LRB): DILATATION AND EVACUATION (N/A) BILATERAL TUBAL LIGATION (Bilateral). A Time Out was held and the above information confirmed.  She was then placed under general anesthesia without difficulty. She was placed in the dorsal lithotomy position, and was prepped and draped in a sterile manner.   A  Red Robinson catheter was used to drain the bladder of urine. A weighted speculum was placed into  the vagina and a single tooth tenaculum was applied to the anterior lip of the cervix. The cervix was gently dilated using Hank's Dilators to accommodate a 7 mm suction curette, that was gently advanced to the uterine fundus.  The suction device was then activated and the curette was slowly rotated to clear the uterine cavity of products of conception.  A sharp curettage with a serrated curette  was then performed to confirm complete emptying of the uterus. A small amount of bleeding was encountered. A Hulka clamp was then placed for uterine manipulation.  The single tooth tenaculum was  then removed.   Attention was then turned to the patient's abdomen where a 11-mm skin incision was made in the umbilical fold.  The Optiview 11-mm trocar and sleeve were then advanced without difficulty with the laparoscope under direct visualization into the abdomen.  The abdomen was then insufflated with carbon dioxide gas and adequate pneumoperitoneum was obtained.  A survey of the patient's pelvis and abdomen revealed entirely normal anatomy.  Moderate adhesions of the omentum to the anterior abdominal wall.  The fallopian tubes were observed and found to be normal in appearance,, with the exception of the right fallopian tube being adherent to the pelvic sidewall.  An 11 mm incision was then made on the left lateral lower region of the abodmen, and a second 11-mm trochar was inserted under direct visualization.  The Filshie clip applicator wasplaced through the operative port and navigated around the adhesions, and a Filshie clip was placed on the right fallopian tube ,about 2-3 cm from the cornual attachment, with care given to incorporate the underlying mesosalpinx.  A similar process was carried out on the contralateral side allowing for bilateral tubal sterilization.   Good hemostasis was noted overall.  The instruments were then removed from the patient's abdomen and the fascial incision was repaired with 0 Vicryl, and the skin was closed with Dermabond.  The uterine manipulator was then removed from the vagina without complications. The patient tolerated the procedure well.  Sponge, lap, and needle counts were correct times two.  The patient was then taken to the recovery room awake, extubated and in stable condition.   The patient will be discharged to home as per PACU criteria.  Routine postoperative instructions were given.  Patient had previously received  a prescription for analgesics in clinic days prior.  She will follow up in the clinic in 1-2 weeks for postoperative  evaluation.  Findings: The uterus was sounded to 9 cm Products of conception noted in suction cannula Moderate abdominal adhesions of the omentum to the anterior abdominal wall.  Left fallopian tube adherent to the pelvic sidewall. Fallopian tubes and ovaries otherwise appeared normal.   Estimated Blood Loss:  100 ml       Drains: straight catheterization prior to procedure with  20 ml of clear urine         Total IV Fluids:  700 ml of Lactated Ringer's  Specimens: Products of conception         Implants: None         Complications:  None; patient tolerated the procedure well.         Disposition: PACU - hemodynamically stable.         Condition: stable   Rubie Maid, MD [facility]

## 2017-04-21 NOTE — Anesthesia Preprocedure Evaluation (Addendum)
Anesthesia Evaluation  Patient identified by MRN, date of birth, ID band Patient awake    Reviewed: Allergy & Precautions, NPO status , Patient's Chart, lab work & pertinent test results, reviewed documented beta blocker date and time   Airway Mallampati: III  TM Distance: >3 FB     Dental  (+) Chipped   Pulmonary sleep apnea and Continuous Positive Airway Pressure Ventilation , former smoker,           Cardiovascular hypertension, Pt. on medications      Neuro/Psych    GI/Hepatic   Endo/Other    Renal/GU Renal disease     Musculoskeletal   Abdominal   Peds  Hematology   Anesthesia Other Findings Obese. Overbite.  Reproductive/Obstetrics                           Anesthesia Physical Anesthesia Plan  ASA: III  Anesthesia Plan: General   Post-op Pain Management:    Induction: Intravenous  PONV Risk Score and Plan:   Airway Management Planned: Oral ETT  Additional Equipment:   Intra-op Plan:   Post-operative Plan:   Informed Consent: I have reviewed the patients History and Physical, chart, labs and discussed the procedure including the risks, benefits and alternatives for the proposed anesthesia with the patient or authorized representative who has indicated his/her understanding and acceptance.     Plan Discussed with: CRNA  Anesthesia Plan Comments:         Anesthesia Quick Evaluation

## 2017-04-21 NOTE — Anesthesia Postprocedure Evaluation (Signed)
Anesthesia Post Note  Patient: Tracy Mason  Procedure(s) Performed: Procedure(s) (LRB): DILATATION AND EVACUATION (N/A) BILATERAL TUBAL LIGATION (Bilateral)  Patient location during evaluation: PACU Anesthesia Type: General Level of consciousness: awake and alert and oriented Pain management: pain level controlled Vital Signs Assessment: post-procedure vital signs reviewed and stable Respiratory status: spontaneous breathing Cardiovascular status: blood pressure returned to baseline Anesthetic complications: no     Last Vitals:  Vitals:   04/21/17 1809 04/21/17 1846  BP: (!) 123/98 111/60  Pulse: (!) 115 85  Resp: 16 16  Temp: 36.5 C 36.7 C  SpO2: 99% 97%    Last Pain:  Vitals:   04/21/17 1846  TempSrc: Tympanic  PainSc: 0-No pain                 Aaro Meyers

## 2017-04-21 NOTE — Transfer of Care (Signed)
Immediate Anesthesia Transfer of Care Note  Patient: Tracy Mason  Procedure(s) Performed: Procedure(s): DILATATION AND EVACUATION (N/A) BILATERAL TUBAL LIGATION (Bilateral)  Patient Location: PACU  Anesthesia Type:General  Level of Consciousness: sedated  Airway & Oxygen Therapy: Patient Spontanous Breathing and Patient connected to face mask oxygen  Post-op Assessment: Report given to RN and Post -op Vital signs reviewed and stable  Post vital signs: Reviewed  Last Vitals:  Vitals:   04/21/17 1203 04/21/17 1648  BP: 132/76 112/62  Pulse: 93 80  Resp: 16 16  Temp: 36.6 C (!) 36.1 C  SpO2: 100% 100%    Last Pain:  Vitals:   04/21/17 1203  TempSrc: Tympanic         Complications: No apparent anesthesia complications

## 2017-04-21 NOTE — Anesthesia Procedure Notes (Signed)
Procedure Name: Intubation Performed by: Rolla Plate Pre-anesthesia Checklist: Patient identified, Patient being monitored, Timeout performed, Emergency Drugs available and Suction available Patient Re-evaluated:Patient Re-evaluated prior to induction Oxygen Delivery Method: Circle system utilized Preoxygenation: Pre-oxygenation with 100% oxygen Induction Type: IV induction Ventilation: Mask ventilation without difficulty Laryngoscope Size: Miller and 2 Grade View: Grade I Tube type: Oral Tube size: 7.0 mm Number of attempts: 1 Airway Equipment and Method: Stylet Placement Confirmation: ETT inserted through vocal cords under direct vision,  positive ETCO2 and breath sounds checked- equal and bilateral Secured at: 21 cm Tube secured with: Tape Dental Injury: Teeth and Oropharynx as per pre-operative assessment

## 2017-04-21 NOTE — Anesthesia Post-op Follow-up Note (Signed)
Anesthesia QCDR form completed.        

## 2017-04-21 NOTE — Progress Notes (Signed)
Pt. Arrived in post-op c/o nausea , vomited approx. 163m , Dr. CKayleen Memosnotified , phenergan ordered.

## 2017-04-22 ENCOUNTER — Encounter: Payer: Self-pay | Admitting: Obstetrics and Gynecology

## 2017-04-25 ENCOUNTER — Encounter: Payer: Self-pay | Admitting: Obstetrics and Gynecology

## 2017-04-25 ENCOUNTER — Telehealth: Payer: Self-pay | Admitting: Obstetrics and Gynecology

## 2017-04-25 LAB — SURGICAL PATHOLOGY

## 2017-04-25 NOTE — Telephone Encounter (Signed)
Called pt informed her that she was ok to return to work as long as she felt well and was not having any pain. Pt denied any pain or sx. States that she resumed work today.

## 2017-04-25 NOTE — Telephone Encounter (Signed)
Patient needs to know if she can return to work and will need a work note, she had D&C last week.  Please call

## 2017-05-05 ENCOUNTER — Encounter: Payer: Self-pay | Admitting: Obstetrics and Gynecology

## 2017-05-05 ENCOUNTER — Ambulatory Visit (INDEPENDENT_AMBULATORY_CARE_PROVIDER_SITE_OTHER): Payer: 59 | Admitting: Obstetrics and Gynecology

## 2017-05-05 VITALS — BP 125/83 | HR 122 | Ht 62.0 in | Wt 163.4 lb

## 2017-05-05 DIAGNOSIS — Z9889 Other specified postprocedural states: Secondary | ICD-10-CM

## 2017-05-05 DIAGNOSIS — Z9851 Tubal ligation status: Secondary | ICD-10-CM

## 2017-05-05 DIAGNOSIS — O039 Complete or unspecified spontaneous abortion without complication: Secondary | ICD-10-CM

## 2017-05-05 NOTE — Progress Notes (Signed)
    OBSTETRICS/GYNECOLOGY POST-OPERATIVE CLINIC VISIT  Subjective:     Tracy Mason is a 41 y.o. female who presents to the clinic 2 weeks status post Suction D&C for missed abortion at ~ [redacted] weeks gestation and laparoscopic BTL for desired permanent sterilization. Eating a regular diet without difficulty. Bowel movements are normal. The patient is not having any pain.  She denies any vaginal bleeding.  She does note that her umbilical incision appears to still be open.    The following portions of the patient's history were reviewed and updated as appropriate: allergies, current medications, past family history, past medical history, past social history, past surgical history and problem list.  Review of Systems Pertinent items noted in HPI and remainder of comprehensive ROS otherwise negative.    Objective:    BP 125/83 (BP Location: Left Arm, Patient Position: Sitting, Cuff Size: Normal)   Pulse (!) 122   Ht 5' 2"  (1.575 m)   Wt 163 lb 6.4 oz (74.1 kg)   LMP 01/31/2017   Breastfeeding? No   BMI 29.89 kg/m  General:  alert and no distress  Abdomen: soft, bowel sounds active, non-tender  Incision:   healing well, no drainage, no erythema, no hernia, no seroma, no swelling.  Umbilical laparoscopic port site with skin edges not approximated, deeper tissues healed.     Pathology:   PRODUCTS OF CONCEPTION; DILATION AND EVACUATION:  - DECIDUA AND CHORIONIC VILLI, CONSISTENT WITH PRODUCTS OF CONCEPTION.   Assessment:    S/p Suction D&C and laparoscopic BTL  Wound separation (at skin edge)   Plan:   1. Patient doing well post-operatively.  2. Wound care discussed.  Steri-strip applied to umbilical (and left laparoscopic) port sites. 3. Operative findings again reviewed (including lower abdominal adhesive disease). Pathology report discussed. 4. Activity restrictions: no gym class x 1 week.  5. Anticipated return to work: now. 6. Follow up: as needed.    Rubie Maid,  MD [facility]

## 2017-05-06 ENCOUNTER — Encounter: Payer: Self-pay | Admitting: Obstetrics and Gynecology

## 2017-05-30 ENCOUNTER — Encounter: Payer: Self-pay | Admitting: Obstetrics and Gynecology

## 2017-05-30 DIAGNOSIS — H8303 Labyrinthitis, bilateral: Secondary | ICD-10-CM | POA: Diagnosis not present

## 2017-06-27 ENCOUNTER — Other Ambulatory Visit: Payer: Self-pay | Admitting: *Deleted

## 2017-06-27 ENCOUNTER — Encounter: Payer: Self-pay | Admitting: Obstetrics and Gynecology

## 2017-06-27 ENCOUNTER — Telehealth: Payer: Self-pay | Admitting: Obstetrics and Gynecology

## 2017-06-27 MED ORDER — CYANOCOBALAMIN 1000 MCG/ML IJ SOLN: 1000.0000 ug | INTRAMUSCULAR | 3 refills | Status: DC

## 2017-06-27 NOTE — Telephone Encounter (Signed)
Done-ac

## 2017-06-27 NOTE — Telephone Encounter (Signed)
Patient needs refill on B12 injection she has an appt on 07/05/2017 [facility]

## 2017-07-05 ENCOUNTER — Ambulatory Visit (INDEPENDENT_AMBULATORY_CARE_PROVIDER_SITE_OTHER): Payer: 59 | Admitting: Obstetrics and Gynecology

## 2017-07-05 ENCOUNTER — Encounter: Payer: Self-pay | Admitting: Obstetrics and Gynecology

## 2017-07-05 VITALS — BP 130/84 | HR 92 | Ht 62.0 in | Wt 164.8 lb

## 2017-07-05 DIAGNOSIS — N946 Dysmenorrhea, unspecified: Secondary | ICD-10-CM

## 2017-07-05 DIAGNOSIS — E669 Obesity, unspecified: Secondary | ICD-10-CM | POA: Diagnosis not present

## 2017-07-05 DIAGNOSIS — Z23 Encounter for immunization: Secondary | ICD-10-CM

## 2017-07-05 DIAGNOSIS — F40243 Fear of flying: Secondary | ICD-10-CM

## 2017-07-05 MED ORDER — HYDROCHLOROTHIAZIDE 25 MG PO TABS: 25.0000 mg | ORAL_TABLET | Freq: Every day | ORAL | 4 refills | Status: DC

## 2017-07-05 MED ORDER — IBUPROFEN 800 MG PO TABS: 800.0000 mg | ORAL_TABLET | Freq: Three times a day (TID) | ORAL | 1 refills | Status: DC | PRN

## 2017-07-05 MED ORDER — PHENTERMINE HCL 37.5 MG PO TABS: ORAL_TABLET | ORAL | 2 refills | Status: DC

## 2017-07-05 MED ORDER — OXYCODONE-ACETAMINOPHEN 5-325 MG PO TABS: 1.0000 | ORAL_TABLET | Freq: Four times a day (QID) | ORAL | 0 refills | Status: DC | PRN

## 2017-07-05 MED ORDER — ALPRAZOLAM 0.5 MG PO TABS: 0.5000 mg | ORAL_TABLET | Freq: Every evening | ORAL | 0 refills | Status: DC | PRN

## 2017-07-05 NOTE — Progress Notes (Signed)
Subjective:     Patient ID: Tracy Mason, female   DOB: [birth year], 41 y.o.   MRN: [id number]  HPI Desires restart weight loss medication. Stopped it for miscarriage. Feels fine and has great support.  Has anxiety about flying and request medication for upcoming flights to [place].     Needs flu shot  Also states menstrual cycles are painful again and needs refill on Norco.  Review of Systems Negative except stated above in HPI    Objective:   Physical Exam A&Ox4 Well groomed female in no distress Blood pressure 130/84, pulse 92, height 5' 2"  (1.575 m), weight 164 lb 12.8 oz (74.8 kg), last menstrual period 06/15/2017. Body mass index is 30.14 kg/m.  Pelvic not indicated.    Assessment:     Obesity Flight anxiety dysmenorhea Needs flu vaccine Hypertension      Plan:     meds refilled. RTC in 4 weeks for wt/BP/B12, B12 given today Xanax 0.75m prn use for flying. Flu vaccine given.    Brylei Pedley,CNM

## 2017-08-01 ENCOUNTER — Ambulatory Visit (INDEPENDENT_AMBULATORY_CARE_PROVIDER_SITE_OTHER): Payer: 59 | Admitting: Obstetrics and Gynecology

## 2017-08-01 ENCOUNTER — Encounter: Payer: Self-pay | Admitting: Obstetrics and Gynecology

## 2017-08-01 VITALS — BP 128/86 | HR 88 | Wt 161.1 lb

## 2017-08-01 DIAGNOSIS — E663 Overweight: Secondary | ICD-10-CM

## 2017-08-01 MED ORDER — CYANOCOBALAMIN 1000 MCG/ML IJ SOLN
1000.0000 ug | Freq: Once | INTRAMUSCULAR | Status: AC
  Administered 2017-08-01: 1000 ug via INTRAMUSCULAR

## 2017-08-01 NOTE — Progress Notes (Signed)
Pt is here for wt, bp check, b-12 inj She is doing well, went to [place] last week, however maintained her weight  08/01/17 wt- 164lb 07/05/17 wt- 164lb

## 2017-08-10 ENCOUNTER — Encounter: Payer: Self-pay | Admitting: Obstetrics and Gynecology

## 2017-08-29 ENCOUNTER — Ambulatory Visit: Payer: 59 | Admitting: Obstetrics and Gynecology

## 2017-09-05 ENCOUNTER — Ambulatory Visit (INDEPENDENT_AMBULATORY_CARE_PROVIDER_SITE_OTHER): Payer: 59 | Admitting: Obstetrics and Gynecology

## 2017-09-05 ENCOUNTER — Encounter: Payer: Self-pay | Admitting: Obstetrics and Gynecology

## 2017-09-05 VITALS — BP 141/80 | HR 87 | Wt 167.4 lb

## 2017-09-05 DIAGNOSIS — E663 Overweight: Secondary | ICD-10-CM

## 2017-09-05 MED ORDER — CYANOCOBALAMIN 1000 MCG/ML IJ SOLN
1000.0000 ug | Freq: Once | INTRAMUSCULAR | Status: AC
  Administered 2017-09-05: 1000 ug via INTRAMUSCULAR

## 2017-09-05 NOTE — Progress Notes (Signed)
Pt is here for wt, bp check, b-12 ij She is doing well  09/05/17 wt- 167.4lb 08/01/17 wt- 161lb

## 2017-09-28 ENCOUNTER — Encounter: Payer: Self-pay | Admitting: Obstetrics and Gynecology

## 2017-09-30 ENCOUNTER — Encounter: Payer: 59 | Admitting: Obstetrics and Gynecology

## 2017-09-30 DIAGNOSIS — S161XXA Strain of muscle, fascia and tendon at neck level, initial encounter: Secondary | ICD-10-CM | POA: Diagnosis not present

## 2017-10-05 ENCOUNTER — Telehealth: Payer: Self-pay | Admitting: Obstetrics and Gynecology

## 2017-10-05 NOTE — Telephone Encounter (Signed)
He patient called in regards to picking up her prescriptions that should have been in the front office for pick up per Amy. Please advise.

## 2017-10-12 ENCOUNTER — Encounter: Payer: Self-pay | Admitting: Obstetrics and Gynecology

## 2017-10-13 NOTE — Telephone Encounter (Signed)
Done-ac

## 2017-10-25 ENCOUNTER — Encounter: Payer: Self-pay | Admitting: Obstetrics and Gynecology

## 2017-10-25 ENCOUNTER — Ambulatory Visit (INDEPENDENT_AMBULATORY_CARE_PROVIDER_SITE_OTHER): Payer: 59 | Admitting: Obstetrics and Gynecology

## 2017-10-25 VITALS — BP 134/74 | HR 90 | Ht 62.0 in | Wt 169.3 lb

## 2017-10-25 DIAGNOSIS — Z0000 Encounter for general adult medical examination without abnormal findings: Secondary | ICD-10-CM

## 2017-10-25 DIAGNOSIS — N926 Irregular menstruation, unspecified: Secondary | ICD-10-CM | POA: Diagnosis not present

## 2017-10-25 DIAGNOSIS — E669 Obesity, unspecified: Secondary | ICD-10-CM | POA: Diagnosis not present

## 2017-10-25 MED ORDER — CYANOCOBALAMIN 1000 MCG/ML IJ SOLN: 1000.0000 ug | INTRAMUSCULAR | 3 refills | Status: DC

## 2017-10-25 MED ORDER — PHENTERMINE HCL 37.5 MG PO TABS: ORAL_TABLET | ORAL | 2 refills | Status: DC

## 2017-10-25 MED ORDER — NORGESTREL-ETHINYL ESTRADIOL 0.3-30 MG-MCG PO TABS: 1.0000 | ORAL_TABLET | Freq: Every day | ORAL | 11 refills | Status: DC

## 2017-10-25 NOTE — Progress Notes (Signed)
Subjective:     Patient ID: Tracy Mason, female   DOB: 1976/06/06, 42 y.o.   MRN: [id number]  HPI Here to discuss irregular menses and possibility of going back on [facility]. Normal menses on Feb 1st for 5 days with normal flow. Then on Feb 13 & 14 had light spotting, then started menses again on 24th for 5 days. Nothing so far in March. Also notices increase in mood swings. Since miscarriage.  Denies spotting after sex or pain with sex.  Tried Nexplanon in past, and was on BCP when got pregnant.  Had tubes tied.  Also desires restart adipex. Has started working out with Physiological scientist last week. Feels like she is mentally ready to get the weight off now.   Review of Systems Negative except stated in HPI.    Objective:   Physical Exam A&O x4 Well groomer female in no distress Blood pressure 134/74, pulse 90, height 5' 2"  (1.575 m), weight 169 lb 4.8 oz (76.8 kg), last menstrual period 10/18/2017. Thyroid not enlarged. Pelvic exam deferred.     Assessment:     Restart BCP Restart weight loss program.    Plan:     To restart OCPs on 4th day of upcoming menses B12 given and restarted along with adipex-will recheck with next visit in 4 weeks for AAE MMG ordered.  Tiffiany Beadles,CNM

## 2017-11-03 ENCOUNTER — Ambulatory Visit
Admission: RE | Admit: 2017-11-03 | Discharge: 2017-11-03 | Disposition: A | Discharge status: Home / Self Care | Payer: 59 | Source: Ambulatory Visit | Attending: Obstetrics and Gynecology | Admitting: Obstetrics and Gynecology

## 2017-11-03 ENCOUNTER — Encounter: Payer: Self-pay | Admitting: Radiology

## 2017-11-03 DIAGNOSIS — Z1231 Encounter for screening mammogram for malignant neoplasm of breast: Secondary | ICD-10-CM | POA: Diagnosis present

## 2017-11-03 DIAGNOSIS — Z0000 Encounter for general adult medical examination without abnormal findings: Secondary | ICD-10-CM

## 2017-11-29 ENCOUNTER — Ambulatory Visit (INDEPENDENT_AMBULATORY_CARE_PROVIDER_SITE_OTHER): Payer: 59 | Admitting: Obstetrics and Gynecology

## 2017-11-29 ENCOUNTER — Encounter: Payer: Self-pay | Admitting: Obstetrics and Gynecology

## 2017-11-29 VITALS — BP 131/89 | HR 95 | Wt 168.3 lb

## 2017-11-29 DIAGNOSIS — E669 Obesity, unspecified: Secondary | ICD-10-CM

## 2017-11-29 MED ORDER — CYANOCOBALAMIN 1000 MCG/ML IJ SOLN
1000.0000 ug | Freq: Once | INTRAMUSCULAR | Status: AC
  Administered 2017-11-29: 1000 ug via INTRAMUSCULAR

## 2017-12-27 ENCOUNTER — Encounter: Payer: Self-pay | Admitting: Obstetrics and Gynecology

## 2017-12-30 ENCOUNTER — Encounter: Payer: Self-pay | Admitting: Obstetrics and Gynecology

## 2017-12-30 ENCOUNTER — Ambulatory Visit (INDEPENDENT_AMBULATORY_CARE_PROVIDER_SITE_OTHER): Payer: 59 | Admitting: Obstetrics and Gynecology

## 2017-12-30 VITALS — BP 125/90 | HR 110 | Ht 62.0 in | Wt 165.5 lb

## 2017-12-30 DIAGNOSIS — E669 Obesity, unspecified: Secondary | ICD-10-CM | POA: Diagnosis not present

## 2017-12-30 MED ORDER — CYANOCOBALAMIN 1000 MCG/ML IJ SOLN
1000.0000 ug | Freq: Once | INTRAMUSCULAR | Status: AC
  Administered 2017-12-30: 1000 ug via INTRAMUSCULAR

## 2017-12-30 NOTE — Progress Notes (Signed)
Pt is here for wt, bp check,. b-12 inj She is doing well  12/30/17 wt- 165.5lb 11/29/16 wt- 168lb

## 2018-01-20 NOTE — Progress Notes (Signed)
Subjective:  Tracy Mason is a 42 y.o. G2P1011 at Unknown being seen today for weight loss management- initial visit.  Patient reports General ROS: negative and reports previous weight loss attempts:have been successful with adipex and B12 Past treatment has included: small frequent feedings, nutritional supplement, vitamin supplement, vitamin B-12 injections, appetite supressant, exercise management and discontinuation of medication.  The following portions of the patient's history were reviewed and updated as appropriate: allergies, current medications, past family history, past medical history, past social history, past surgical history and problem list.   Objective:   Vitals:   11/29/17 0812  BP: 131/89  Pulse: 95  Weight: 168 lb 4.8 oz (76.3 kg)    General:  Alert, oriented and cooperative. Patient is in no acute distress.  :   :   :   :   :   :   PE: Well groomed female in no current distress,   Mental Status: Normal mood and affect. Normal behavior. Normal judgment and thought content.   Current BMI: Body mass index is 30.78 kg/m.   Assessment and Plan:  Obesity  1. Obesity (BMI 30-39.9)    Plan: low carb, High protein diet RX for adipex 37.5 mg daily and B12 1068mg.ml monthly, to start now with first injection given at today's visit. Reviewed side-effects common to both medications and expected outcomes. Increase daily water intake to at least 8 bottle a day, every day.  Goal is to reduse weight by 10% by end of three months, and will re-evaluate then.  RTC in 4 weeks for Nurse visit to check weight & BP, and get next B12 injections.    Please refer to After Visit Summary for other counseling recommendations.    Tracy Mason, Tracy Mason   Tracy Mason      Consider the Low Glycemic Index Diet and 6 smaller meals daily .  This boosts your metabolism and regulates your sugars:   Use the protein bar by [employer]kins because they have lots of  fiber in them  Find the low carb flatbreads, tortillas and pita breads for sandwiches:  Joseph's makes a pita bread and a flat bread , available at [facility] [employer]; TLookingglassmakes a low carb flatbread available at [employer]and HT that is 9 net carbs and 100 cal Mission makes a low carb whole wheat tortilla available at [employer]most grocery stores with 6 net carbs and 210 cal  GMayotteyogurt can still have a lot of carbs .  Dannon Light Mason fit has 80 cal and 8 carbs

## 2018-03-02 ENCOUNTER — Ambulatory Visit (INDEPENDENT_AMBULATORY_CARE_PROVIDER_SITE_OTHER): Payer: 59 | Admitting: Obstetrics and Gynecology

## 2018-03-02 ENCOUNTER — Encounter: Payer: Self-pay | Admitting: Obstetrics and Gynecology

## 2018-03-02 ENCOUNTER — Other Ambulatory Visit: Payer: Self-pay | Admitting: Obstetrics and Gynecology

## 2018-03-02 VITALS — BP 130/90 | HR 103 | Ht 62.0 in | Wt 166.0 lb

## 2018-03-02 DIAGNOSIS — N946 Dysmenorrhea, unspecified: Secondary | ICD-10-CM | POA: Diagnosis not present

## 2018-03-02 DIAGNOSIS — E663 Overweight: Secondary | ICD-10-CM | POA: Diagnosis not present

## 2018-03-02 DIAGNOSIS — Z01419 Encounter for gynecological examination (general) (routine) without abnormal findings: Secondary | ICD-10-CM

## 2018-03-02 MED ORDER — NORGESTREL-ETHINYL ESTRADIOL 0.3-30 MG-MCG PO TABS: 1.0000 | ORAL_TABLET | Freq: Every day | ORAL | 4 refills | Status: DC

## 2018-03-02 NOTE — Progress Notes (Signed)
Subjective:   Tracy Mason is a 42 y.o. G46P1011 Caucasian female here for a routine well-woman exam.  Patient's last menstrual period was 02/06/2018.    Current complaints: Pt reports that she is still having pain 1-2 days prior to period to the point that it interferes with ADL's. She reports that on the birth control the length of period has decreased from 12-14 days to 5-6 days but the menstrual flow is still heavy.  PCP: Lauralyn Primes obtained by PCP  Social History: Sexual: heterosexual Marital Status: married Living situation: with family Occupation: unknown occupation Tobacco/alcohol: no alcohol or tobacco use Illicit drugs: no history of illicit drug use  The following portions of the patient's history were reviewed and updated as appropriate: allergies, current medications, past family history, past medical history, past social history, past surgical history and problem list.  Past Medical History Past Medical History:  Diagnosis Date  . Hypertension   . Kidney stones   . Lower abdominal adhesions   . Nephrolithiasis    Approximately 20-25 episodes  . Ovarian cyst   . Pelvic pain in female   . Sleep apnea     Past Surgical History Past Surgical History:  Procedure Laterality Date  . APPENDECTOMY  1988  . BREAST CYST EXCISION Left 1994   benign  . BREAST SURGERY Right 1993   benign biopsy  . CHOLECYSTECTOMY  2001  . DILATION AND EVACUATION N/A 04/21/2017   Procedure: DILATATION AND EVACUATION;  Surgeon: Rubie Maid, MD;  Location: [facility] ORS;  Service: Gynecology;  Laterality: N/A;  . TUBAL LIGATION Bilateral 04/21/2017   Procedure: BILATERAL TUBAL LIGATION;  Surgeon: Rubie Maid, MD;  Location: [facility] ORS;  Service: Gynecology;  Laterality: Bilateral;  . UTERINE FIBROID SURGERY  2014   benign    Gynecologic History G2P1011  Patient's last menstrual period was 02/06/2018. Contraception: tubal ligation Last Pap: 2016. Results were: normal Last  mammogram: 10/2017. Results were: normal   Obstetric History OB History  Gravida Para Term Preterm AB Living  2 1 1   1 1   SAB TAB Ectopic Multiple Live Births  1       1    # Outcome Date GA Lbr Len/2nd Weight Sex Delivery Anes PTL Lv  2 SAB 04/18/17 [redacted]w[redacted]d   SAB     1 Term 2006 [id number] lb 11 oz (3.487 kg) F Vag-Spont EPI N LIV    Obstetric Comments  Gestational diabetes, PIH with severe features, IOL at 37 weeks, with now chronic HTN.    Current Medications Current Outpatient Medications on File Prior to Visit  Medication Sig Dispense Refill  . ALPRAZolam (XANAX) 0.5 MG tablet Take 1 tablet (0.5 mg total) at bedtime as needed by mouth for anxiety. Or as needed 15 tablet 0  . cyanocobalamin (,VITAMIN B-12,) 1000 MCG/ML injection Inject 1 mL (1,000 mcg total) into the muscle every 30 (thirty) days. 1 mL 3  . hydrochlorothiazide (HYDRODIURIL) 25 MG tablet Take 1 tablet (25 mg total) daily by mouth. 90 tablet 4  . norgestrel-ethinyl estradiol (LO/OVRAL,CRYSELLE) 0.3-30 MG-MCG tablet Take 1 tablet by mouth daily. 1 Package 11  . oxyCODONE-acetaminophen (PERCOCET/ROXICET) 5-325 MG tablet Take 1-2 tablets every 6 (six) hours as needed by mouth. 30 tablet 0  . phentermine (ADIPEX-P) 37.5 MG tablet TAKE 1 TABLET EVERY DAY BEFORE BREAKFAST 30 tablet 2  . ibuprofen (ADVIL,MOTRIN) 800 MG tablet Take 1 tablet (800 mg total) every 8 (eight)  hours as needed by mouth for mild pain or cramping. (Patient not taking: Reported on 08/01/2017) 30 tablet 1   No current facility-administered medications on file prior to visit.     Review of Systems Patient denies any headaches, blurred vision, shortness of breath, chest pain, abdominal pain, problems with bowel movements, urination, or intercourse.  Objective:  BP 130/90   Pulse (!) 103   Ht 5' 2"  (1.575 m)   Wt 166 lb (75.3 kg)   LMP 02/06/2018   BMI 30.36 kg/m  Physical Exam  General:  Well developed, well nourished, no acute distress. She  is alert and oriented x3. Skin:  Warm and dry Neck:  Midline trachea, no thyromegaly or nodules Cardiovascular: Regular rate and rhythm, no murmur heard Lungs:  Effort normal, all lung fields clear to auscultation bilaterally Breasts:  No dominant palpable mass, retraction, or nipple discharge Abdomen:  Soft, non tender, no hepatosplenomegaly or masses Pelvic:  External genitalia is normal in appearance.  The vagina is normal in appearance. The cervix is bulbous, no CMT.  Thin prep pap is done with HR HPV cotesting. Uterus is felt to be normal size, shape, and contour.  No adnexal masses or tenderness noted. Extremities:  No swelling or varicosities noted Psych:  She has a normal mood and affect  Assessment:   Healthy well-woman exam Obesity Dysmenorrhea  Heavy menstrual bleeding   Plan:  Continue Adipex and B12 Continue pain medication for dysmenorrhea Start to take birth control for 3 month period continuosly then take one week of placebo  F/U 1 year for AE, or sooner if needed   Raziah Funnell Rockney Ghee, CNM

## 2018-03-02 NOTE — Patient Instructions (Signed)
Preventive Care 18-39 Years, Female Preventive care refers to lifestyle choices and visits with your health care provider that can promote health and wellness. What does preventive care include?  A yearly physical exam. This is also called an annual well check.  Dental exams once or twice a year.  Routine eye exams. Ask your health care provider how often you should have your eyes checked.  Personal lifestyle choices, including: ? Daily care of your teeth and gums. ? Regular physical activity. ? Eating a healthy diet. ? Avoiding tobacco and drug use. ? Limiting alcohol use. ? Practicing safe sex. ? Taking vitamin and mineral supplements as recommended by your health care provider. What happens during an annual well check? The services and screenings done by your health care provider during your annual well check will depend on your age, overall health, lifestyle risk factors, and family history of disease. Counseling Your health care provider may ask you questions about your:  Alcohol use.  Tobacco use.  Drug use.  Emotional well-being.  Home and relationship well-being.  Sexual activity.  Eating habits.  Work and work Statistician.  Method of birth control.  Menstrual cycle.  Pregnancy history.  Screening You may have the following tests or measurements:  Height, weight, and BMI.  Diabetes screening. This is done by checking your blood sugar (glucose) after you have not eaten for a while (fasting).  Blood pressure.  Lipid and cholesterol levels. These may be checked every 5 years starting at age 38.  Skin check.  Hepatitis C blood test.  Hepatitis B blood test.  Sexually transmitted disease (STD) testing.  BRCA-related cancer screening. This may be done if you have a family history of breast, ovarian, tubal, or peritoneal cancers.  Pelvic exam and Pap test. This may be done every 3 years starting at age 38. Starting at age 30, this may be done  every 5 years if you have a Pap test in combination with an HPV test.  Discuss your test results, treatment options, and if necessary, the need for more tests with your health care provider. Vaccines Your health care provider may recommend certain vaccines, such as:  Influenza vaccine. This is recommended every year.  Tetanus, diphtheria, and acellular pertussis (Tdap, Td) vaccine. You may need a Td booster every 10 years.  Varicella vaccine. You may need this if you have not been vaccinated.  HPV vaccine. If you are 39 or younger, you may need three doses over 6 months.  Measles, mumps, and rubella (MMR) vaccine. You may need at least one dose of MMR. You may also need a second dose.  Pneumococcal 13-valent conjugate (PCV13) vaccine. You may need this if you have certain conditions and were not previously vaccinated.  Pneumococcal polysaccharide (PPSV23) vaccine. You may need one or two doses if you smoke cigarettes or if you have certain conditions.  Meningococcal vaccine. One dose is recommended if you are age 68-21 years and a first-year college student living in a residence hall, or if you have one of several medical conditions. You may also need additional booster doses.  Hepatitis A vaccine. You may need this if you have certain conditions or if you travel or work in places where you may be exposed to hepatitis A.  Hepatitis B vaccine. You may need this if you have certain conditions or if you travel or work in places where you may be exposed to hepatitis B.  Haemophilus influenzae type b (Hib) vaccine. You may need this  if you have certain risk factors.  Talk to your health care provider about which screenings and vaccines you need and how often you need them. This information is not intended to replace advice given to you by your health care provider. Make sure you discuss any questions you have with your health care provider. Document Released: 09/28/2001 Document Revised:  04/21/2016 Document Reviewed: 06/03/2015 Elsevier Interactive Patient Education  2018 Elsevier Inc.  

## 2018-03-03 LAB — CYTOLOGY - PAP

## 2018-03-27 ENCOUNTER — Ambulatory Visit: Payer: 59 | Admitting: Obstetrics and Gynecology

## 2018-03-31 ENCOUNTER — Encounter: Payer: Self-pay | Admitting: Obstetrics and Gynecology

## 2018-03-31 ENCOUNTER — Ambulatory Visit (INDEPENDENT_AMBULATORY_CARE_PROVIDER_SITE_OTHER): Payer: 59 | Admitting: Obstetrics and Gynecology

## 2018-03-31 VITALS — BP 128/88 | HR 87 | Ht 62.0 in | Wt 165.0 lb

## 2018-03-31 DIAGNOSIS — E669 Obesity, unspecified: Secondary | ICD-10-CM | POA: Diagnosis not present

## 2018-03-31 NOTE — Progress Notes (Signed)
SUBJECTIVE:  42 y.o. here for follow-up weight loss visit, previously seen 4 weeks ago. Denies any concerns. Is down 1 lb since   OBJECTIVE:  BP 128/88   Pulse 87   Ht 5' 2"  (1.575 m)   Wt 165 lb (74.8 kg)   BMI 30.18 kg/m   Body mass index is 30.18 kg/m. Patient appears well. ASSESSMENT:  Obesity- responding well to weight loss plan PLAN:  To continue with current medications. B12 1019mg/ml injection given RTC in 4 weeks as planned  Gadge Hermiz SRevere CNM

## 2018-04-28 ENCOUNTER — Ambulatory Visit: Payer: 59

## 2018-05-17 ENCOUNTER — Encounter: Payer: Self-pay | Admitting: Obstetrics and Gynecology

## 2018-05-17 ENCOUNTER — Ambulatory Visit: Payer: 59 | Admitting: Obstetrics and Gynecology

## 2018-05-17 VITALS — BP 131/88 | HR 113 | Ht 62.0 in | Wt 166.2 lb

## 2018-05-17 DIAGNOSIS — E669 Obesity, unspecified: Secondary | ICD-10-CM | POA: Diagnosis not present

## 2018-05-17 DIAGNOSIS — Z6830 Body mass index (BMI) 30.0-30.9, adult: Secondary | ICD-10-CM | POA: Diagnosis not present

## 2018-05-17 DIAGNOSIS — Z79899 Other long term (current) drug therapy: Secondary | ICD-10-CM | POA: Diagnosis not present

## 2018-05-17 DIAGNOSIS — I10 Essential (primary) hypertension: Secondary | ICD-10-CM

## 2018-05-17 MED ORDER — OXYCODONE-ACETAMINOPHEN 5-325 MG PO TABS: 1.0000 | ORAL_TABLET | Freq: Four times a day (QID) | ORAL | 0 refills | Status: DC | PRN

## 2018-05-17 MED ORDER — HYDROCHLOROTHIAZIDE 25 MG PO TABS: 25.0000 mg | ORAL_TABLET | Freq: Every day | ORAL | 4 refills | Status: DC

## 2018-05-17 MED ORDER — CYANOCOBALAMIN 1000 MCG/ML IJ SOLN: 1000.0000 ug | INTRAMUSCULAR | 3 refills | Status: DC

## 2018-05-17 MED ORDER — PHENTERMINE HCL 37.5 MG PO TABS: ORAL_TABLET | ORAL | 2 refills | Status: DC

## 2018-05-17 NOTE — Addendum Note (Signed)
Addended by: Keturah Barre L on: 05/17/2018 01:12 PM   Modules accepted: Orders

## 2018-05-17 NOTE — Progress Notes (Signed)
SUBJECTIVE:  42 y.o. here for follow-up weight loss visit, previously seen 8 weeks ago. Weight went back up 1 #. Denies any concerns and feels like medication works when she takes it consistently and is exercising. Just started working out again 5 days a week with cardi and weight resistance. Has not been drinking much water or watching what she is eating though. She also needs BP medication and pain pill for dysmeorrhea refilled while she is here.  OBJECTIVE:  BP 131/88   Pulse (!) 113   Ht 5' 2"  (1.575 m)   Wt 166 lb 3.2 oz (75.4 kg)   BMI 30.40 kg/m   Body mass index is 30.4 kg/m. Patient appears well. Waist circum. 41"  ASSESSMENT:  Obesity HTN  PLAN:  To continue with current medications after taking 7 days off. B12 102mg/ml injection given counseled on weight loss of at least 5% by two months or will not continue with prescribing the medication. States a clear under standing. Medications refilled as desired. RTC in 5 weeks as planned  Elesha Thedford SToftrees CNM

## 2018-06-07 DIAGNOSIS — Z23 Encounter for immunization: Secondary | ICD-10-CM | POA: Diagnosis not present

## 2018-06-21 ENCOUNTER — Encounter: Payer: Self-pay | Admitting: Obstetrics and Gynecology

## 2018-06-21 ENCOUNTER — Ambulatory Visit: Payer: 59 | Admitting: Obstetrics and Gynecology

## 2018-06-21 VITALS — BP 142/84 | HR 103 | Ht 62.0 in | Wt 165.2 lb

## 2018-06-21 DIAGNOSIS — E663 Overweight: Secondary | ICD-10-CM | POA: Diagnosis not present

## 2018-06-21 MED ORDER — CYANOCOBALAMIN 1000 MCG/ML IJ SOLN
1000.0000 ug | Freq: Once | INTRAMUSCULAR | Status: AC
  Administered 2018-06-21: 1000 ug via INTRAMUSCULAR

## 2018-06-21 NOTE — Progress Notes (Signed)
Pt is here for wt, bp check, b-12 inj She is doing well,   06/21/18 wt- 165.2lb 05/17/18 wt- 166lb   Waist 31.5lb

## 2018-08-01 ENCOUNTER — Encounter: Payer: 59 | Admitting: Obstetrics and Gynecology

## 2018-08-23 ENCOUNTER — Other Ambulatory Visit: Payer: Self-pay | Admitting: Obstetrics and Gynecology

## 2018-08-23 MED ORDER — OXYCODONE-ACETAMINOPHEN 5-325 MG PO TABS: 1.0000 | ORAL_TABLET | Freq: Four times a day (QID) | ORAL | 0 refills | Status: DC | PRN

## 2018-08-23 MED ORDER — PHENTERMINE HCL 37.5 MG PO TABS: ORAL_TABLET | ORAL | 1 refills | Status: DC

## 2018-08-24 ENCOUNTER — Encounter: Payer: Self-pay | Admitting: Obstetrics and Gynecology

## 2018-08-24 ENCOUNTER — Ambulatory Visit (INDEPENDENT_AMBULATORY_CARE_PROVIDER_SITE_OTHER): Payer: 59 | Admitting: Obstetrics and Gynecology

## 2018-08-24 VITALS — BP 142/84 | HR 91 | Ht 62.0 in | Wt 171.8 lb

## 2018-08-24 DIAGNOSIS — Z79899 Other long term (current) drug therapy: Secondary | ICD-10-CM | POA: Diagnosis not present

## 2018-08-24 DIAGNOSIS — E669 Obesity, unspecified: Secondary | ICD-10-CM

## 2018-08-24 MED ORDER — PHENTERMINE HCL 37.5 MG PO TABS: 37.5000 mg | ORAL_TABLET | Freq: Every day | ORAL | 2 refills | Status: DC

## 2018-08-24 MED ORDER — OXYCODONE-ACETAMINOPHEN 5-325 MG PO TABS: 1.0000 | ORAL_TABLET | Freq: Four times a day (QID) | ORAL | 0 refills | Status: DC | PRN

## 2018-08-24 NOTE — Progress Notes (Signed)
  Desires restarting weight loss program. Has done well in the past but took a break for the last few months. Is still doing boot camp at gym 2-3 times a week and just added kick boxing classes.  08/24/2018 Wt 171.12 lbs  BP 142/84 06/21/18  Wt 165.3 lbs  BP 142/84 (waist 31.5)  Waist 32 in  Will restart tomorrow, B12 injection given. RTC 4 weeks. Understands need to see at least 5% weight loss in next 2 months.    Melody Shambley,CNM

## 2018-09-07 DIAGNOSIS — S5011XA Contusion of right forearm, initial encounter: Secondary | ICD-10-CM | POA: Diagnosis not present

## 2018-09-07 DIAGNOSIS — W010XXA Fall on same level from slipping, tripping and stumbling without subsequent striking against object, initial encounter: Secondary | ICD-10-CM | POA: Diagnosis not present

## 2018-09-19 DIAGNOSIS — R6889 Other general symptoms and signs: Secondary | ICD-10-CM | POA: Diagnosis not present

## 2018-09-19 DIAGNOSIS — J101 Influenza due to other identified influenza virus with other respiratory manifestations: Secondary | ICD-10-CM | POA: Diagnosis not present

## 2018-12-07 ENCOUNTER — Other Ambulatory Visit: Payer: Self-pay | Admitting: Obstetrics and Gynecology

## 2018-12-07 MED ORDER — PHENTERMINE HCL 37.5 MG PO TABS: 37.5000 mg | ORAL_TABLET | Freq: Every day | ORAL | 2 refills | Status: DC

## 2018-12-07 MED ORDER — OXYCODONE-ACETAMINOPHEN 5-325 MG PO TABS: 1.0000 | ORAL_TABLET | Freq: Four times a day (QID) | ORAL | 0 refills | Status: DC | PRN

## 2018-12-12 ENCOUNTER — Telehealth: Payer: Self-pay | Admitting: Obstetrics and Gynecology

## 2018-12-12 ENCOUNTER — Encounter: Payer: Self-pay | Admitting: *Deleted

## 2018-12-12 NOTE — Telephone Encounter (Signed)
mcm- mailed to pt

## 2018-12-12 NOTE — Telephone Encounter (Signed)
The patient called and stated that she is to have a medication prescription mailed to her. The patient is checking the status of that request. Please advise.

## 2019-02-21 ENCOUNTER — Other Ambulatory Visit: Payer: Self-pay | Admitting: Obstetrics and Gynecology

## 2019-02-21 MED ORDER — OXYCODONE-ACETAMINOPHEN 5-325 MG PO TABS: 1.0000 | ORAL_TABLET | Freq: Four times a day (QID) | ORAL | 0 refills | Status: DC | PRN

## 2019-02-21 MED ORDER — PHENTERMINE HCL 37.5 MG PO TABS: 37.5000 mg | ORAL_TABLET | Freq: Every day | ORAL | 2 refills | Status: DC

## 2019-03-08 ENCOUNTER — Ambulatory Visit: Payer: 59 | Admitting: Obstetrics and Gynecology

## 2019-03-27 ENCOUNTER — Encounter: Payer: 59 | Admitting: Obstetrics and Gynecology

## 2019-05-10 ENCOUNTER — Other Ambulatory Visit: Payer: Self-pay | Admitting: Obstetrics and Gynecology

## 2019-05-10 MED ORDER — PHENTERMINE HCL 37.5 MG PO TABS: 37.5000 mg | ORAL_TABLET | Freq: Every day | ORAL | 2 refills | Status: DC

## 2019-05-10 MED ORDER — OXYCODONE-ACETAMINOPHEN 5-325 MG PO TABS: 1.0000 | ORAL_TABLET | Freq: Four times a day (QID) | ORAL | 0 refills | Status: DC | PRN

## 2019-05-24 ENCOUNTER — Other Ambulatory Visit: Payer: Self-pay | Admitting: Infectious Diseases

## 2019-05-24 DIAGNOSIS — R1030 Lower abdominal pain, unspecified: Secondary | ICD-10-CM

## 2019-05-24 DIAGNOSIS — N200 Calculus of kidney: Secondary | ICD-10-CM

## 2019-05-25 ENCOUNTER — Encounter: Payer: 59 | Admitting: Obstetrics and Gynecology

## 2019-05-25 ENCOUNTER — Ambulatory Visit: Payer: 59

## 2019-05-27 ENCOUNTER — Other Ambulatory Visit: Payer: Self-pay | Admitting: Obstetrics and Gynecology

## 2019-05-30 ENCOUNTER — Ambulatory Visit: Payer: 59

## 2019-08-13 ENCOUNTER — Ambulatory Visit: Payer: 59 | Attending: Internal Medicine

## 2019-08-13 ENCOUNTER — Other Ambulatory Visit: Payer: 59

## 2019-08-13 DIAGNOSIS — Z20822 Contact with and (suspected) exposure to covid-19: Secondary | ICD-10-CM

## 2019-08-15 LAB — NOVEL CORONAVIRUS, NAA: SARS-CoV-2, NAA: NOT DETECTED

## 2020-11-12 ENCOUNTER — Other Ambulatory Visit: Payer: Self-pay | Admitting: Obstetrics and Gynecology

## 2020-11-12 DIAGNOSIS — Z1231 Encounter for screening mammogram for malignant neoplasm of breast: Secondary | ICD-10-CM

## 2020-12-19 ENCOUNTER — Other Ambulatory Visit: Payer: Self-pay

## 2020-12-19 ENCOUNTER — Ambulatory Visit
Admission: RE | Admit: 2020-12-19 | Discharge: 2020-12-19 | Disposition: A | Discharge status: Home / Self Care | Payer: 59 | Source: Ambulatory Visit | Attending: Obstetrics and Gynecology | Admitting: Obstetrics and Gynecology

## 2020-12-19 DIAGNOSIS — Z1231 Encounter for screening mammogram for malignant neoplasm of breast: Secondary | ICD-10-CM

## 2021-05-10 LAB — EXTERNAL GENERIC LAB PROCEDURE: COLOGUARD: NEGATIVE

## 2021-05-10 LAB — COLOGUARD: COLOGUARD: NEGATIVE

## 2023-06-09 ENCOUNTER — Other Ambulatory Visit: Payer: Self-pay | Admitting: Obstetrics

## 2023-06-09 DIAGNOSIS — Z1231 Encounter for screening mammogram for malignant neoplasm of breast: Secondary | ICD-10-CM

## 2023-06-28 ENCOUNTER — Ambulatory Visit
Admission: RE | Admit: 2023-06-28 | Discharge: 2023-06-28 | Disposition: A | Discharge status: Home / Self Care | Payer: Managed Care, Other (non HMO) | Source: Ambulatory Visit | Attending: Obstetrics | Admitting: Obstetrics

## 2023-06-28 DIAGNOSIS — Z1231 Encounter for screening mammogram for malignant neoplasm of breast: Secondary | ICD-10-CM | POA: Diagnosis present

## 2024-02-12 ENCOUNTER — Other Ambulatory Visit: Payer: Self-pay

## 2024-02-12 ENCOUNTER — Emergency Department
Admission: EM | Admit: 2024-02-12 | Discharge: 2024-02-12 | Disposition: A | Discharge status: Home / Self Care | Attending: Emergency Medicine | Admitting: Emergency Medicine

## 2024-02-12 ENCOUNTER — Emergency Department

## 2024-02-12 DIAGNOSIS — E119 Type 2 diabetes mellitus without complications: Secondary | ICD-10-CM | POA: Diagnosis not present

## 2024-02-12 DIAGNOSIS — R109 Unspecified abdominal pain: Secondary | ICD-10-CM | POA: Diagnosis present

## 2024-02-12 DIAGNOSIS — I10 Essential (primary) hypertension: Secondary | ICD-10-CM | POA: Insufficient documentation

## 2024-02-12 DIAGNOSIS — N132 Hydronephrosis with renal and ureteral calculous obstruction: Secondary | ICD-10-CM | POA: Insufficient documentation

## 2024-02-12 DIAGNOSIS — N200 Calculus of kidney: Secondary | ICD-10-CM

## 2024-02-12 HISTORY — DX: Type 2 diabetes mellitus without complications: E11.9

## 2024-02-12 LAB — URINALYSIS, ROUTINE W REFLEX MICROSCOPIC
Bacteria, UA: NONE SEEN
Glucose, UA: NEGATIVE mg/dL
Ketones, ur: 5 mg/dL — AB
Leukocytes,Ua: NEGATIVE
Nitrite: NEGATIVE
Protein, ur: 100 mg/dL — AB
RBC / HPF: >50 RBC/hpf (ref 0–5)
Specific Gravity, Urine: 1.032 — ABNORMAL HIGH (ref 1.005–1.030)
pH: 5 (ref 5.0–8.0)

## 2024-02-12 LAB — POC URINE PREG, ED: Preg Test, Ur: NEGATIVE

## 2024-02-12 LAB — BASIC METABOLIC PANEL WITH GFR
Anion gap: 16 — ABNORMAL HIGH (ref 5–15)
BUN: 6 mg/dL (ref 6–20)
CO2: 26 mmol/L (ref 22–32)
Calcium: 9.2 mg/dL (ref 8.9–10.3)
Chloride: 96 mmol/L — ABNORMAL LOW (ref 98–111)
Creatinine, Ser: 0.74 mg/dL (ref 0.44–1.00)
GFR, Estimated: >60 mL/min (ref >60)
Glucose, Bld: 136 mg/dL — ABNORMAL HIGH (ref 70–99)
Potassium: 2.8 mmol/L — ABNORMAL LOW (ref 3.5–5.1)
Sodium: 138 mmol/L (ref 135–145)

## 2024-02-12 LAB — CBC
HCT: 39.1 % (ref 36.0–46.0)
Hemoglobin: 13.3 g/dL (ref 12.0–15.0)
MCH: 29.2 pg (ref 26.0–34.0)
MCHC: 34 g/dL (ref 30.0–36.0)
MCV: 85.9 fL (ref 80.0–100.0)
Platelets: 380 10*3/uL (ref 150–400)
RBC: 4.55 MIL/uL (ref 3.87–5.11)
RDW: 13.5 % (ref 11.5–15.5)
WBC: 13 10*3/uL — ABNORMAL HIGH (ref 4.0–10.5)
nRBC: 0 % (ref 0.0–0.2)

## 2024-02-12 MED ORDER — CEPHALEXIN 500 MG PO CAPS: 500.0000 mg | ORAL_CAPSULE | Freq: Three times a day (TID) | ORAL | 0 refills | Status: AC

## 2024-02-12 MED ORDER — OXYCODONE-ACETAMINOPHEN 5-325 MG PO TABS: 1.0000 | ORAL_TABLET | ORAL | 0 refills | Status: DC | PRN

## 2024-02-12 MED ORDER — HYDROMORPHONE HCL 1 MG/ML IJ SOLN
1.0000 mg | Freq: Once | INTRAMUSCULAR | Status: AC
  Administered 2024-02-12: 1 mg via INTRAVENOUS
  Filled 2024-02-12: qty 1

## 2024-02-12 MED ORDER — SODIUM CHLORIDE 0.9 % IV BOLUS
1000.0000 mL | Freq: Once | INTRAVENOUS | Status: AC
  Administered 2024-02-12: 1000 mL via INTRAVENOUS

## 2024-02-12 MED ORDER — KETOROLAC TROMETHAMINE 10 MG PO TABS: 10.0000 mg | ORAL_TABLET | Freq: Four times a day (QID) | ORAL | 0 refills | Status: DC | PRN

## 2024-02-12 MED ORDER — MORPHINE SULFATE (PF) 4 MG/ML IV SOLN
4.0000 mg | Freq: Once | INTRAVENOUS | Status: AC
  Administered 2024-02-12: 4 mg via INTRAVENOUS
  Filled 2024-02-12: qty 1

## 2024-02-12 MED ORDER — KETOROLAC TROMETHAMINE 30 MG/ML IJ SOLN
15.0000 mg | Freq: Once | INTRAMUSCULAR | Status: AC
  Administered 2024-02-12: 15 mg via INTRAVENOUS
  Filled 2024-02-12: qty 1

## 2024-02-12 MED ORDER — TAMSULOSIN HCL 0.4 MG PO CAPS: 0.4000 mg | ORAL_CAPSULE | Freq: Every day | ORAL | 0 refills | Status: DC

## 2024-02-12 MED ORDER — ONDANSETRON HCL 4 MG/2ML IJ SOLN
4.0000 mg | Freq: Once | INTRAMUSCULAR | Status: AC
  Administered 2024-02-12: 4 mg via INTRAVENOUS
  Filled 2024-02-12: qty 2

## 2024-02-12 MED ORDER — POTASSIUM CHLORIDE CRYS ER 20 MEQ PO TBCR: 20.0000 meq | EXTENDED_RELEASE_TABLET | Freq: Two times a day (BID) | ORAL | 0 refills | Status: DC

## 2024-02-12 MED ORDER — OXYCODONE-ACETAMINOPHEN 5-325 MG PO TABS
1.0000 | ORAL_TABLET | Freq: Once | ORAL | Status: AC
  Administered 2024-02-12: 1 via ORAL
  Filled 2024-02-12: qty 1

## 2024-02-12 NOTE — Discharge Instructions (Signed)
 Follow-up with urology.  Please call for an appointment. Take the medications as prescribed.  Drink plenty of water. Return to emergency department if worsening

## 2024-02-12 NOTE — ED Triage Notes (Signed)
 Pt to ED for L flank pain since 3 hours ago. Hx of same. No dysuria.  Currently on period. Takes Ozempic since 6 months.

## 2024-02-12 NOTE — ED Provider Notes (Signed)
 [facility] Provider Note    Event Date/Time   First MD Initiated Contact with Patient 02/12/24 1802     (approximate)   History   Flank Pain   HPI  Tracy Mason is a 48 y.o. female with history of kidney stones, diabetes and hypertension presents to the emergency department for left flank pain.  Patient states started suddenly about 3 hours ago..  No dysuria.  Unsure if she has blood in the urine.  States this feels like a kidney stone.  Patient has been on Ozempic for about 6 months.  Has had no problem with this medication.      Physical Exam   Triage Vital Signs: ED Triage Vitals  Encounter Vitals Group     BP 02/12/24 1747 (!) 179/117     Girls Systolic BP Percentile --      Girls Diastolic BP Percentile --      Boys Systolic BP Percentile --      Boys Diastolic BP Percentile --      Pulse Rate 02/12/24 1747 90     Resp 02/12/24 1747 20     Temp 02/12/24 1749 98 F (36.7 C)     Temp Source 02/12/24 1747 Oral     SpO2 02/12/24 1747 99 %     Weight 02/12/24 1748 165 lb (74.8 kg)     Height 02/12/24 1748 5' 2 (1.575 m)     Head Circumference --      Peak Flow --      Pain Score 02/12/24 1746 9     Pain Loc --      Pain Education --      Exclude from Growth Chart --     Most recent vital signs: Vitals:   02/12/24 1749 02/12/24 2100  BP:  (!) 158/97  Pulse:  78  Resp:  16  Temp: 98 F (36.7 C)   SpO2:  92%     General: Awake, no distress.   CV:  Good peripheral perfusion. regular rate and  rhythm Resp:  Normal effort. Lungs cta Abd:  No distention.  No CVA tenderness, Other:      ED Results / Procedures / Treatments   Labs (all labs ordered are listed, but only abnormal results are displayed) Labs Reviewed  URINALYSIS, ROUTINE W REFLEX MICROSCOPIC - Abnormal; Notable for the following components:      Result Value   Color, Urine AMBER (*)    APPearance CLOUDY (*)    Specific Gravity, Urine 1.032 (*)    Hgb urine  dipstick LARGE (*)    Bilirubin Urine SMALL (*)    Ketones, ur 5 (*)    Protein, ur 100 (*)    All other components within normal limits  CBC - Abnormal; Notable for the following components:   WBC 13.0 (*)    All other components within normal limits  BASIC METABOLIC PANEL WITH GFR - Abnormal; Notable for the following components:   Potassium 2.8 (*)    Chloride 96 (*)    Glucose, Bld 136 (*)    Anion gap 16 (*)    All other components within normal limits  POC URINE PREG, ED     EKG     RADIOLOGY CT renal stone    PROCEDURES:   Procedures  Critical Care:  no Chief Complaint  Patient presents with   Flank Pain      MEDICATIONS ORDERED IN ED: Medications  sodium chloride  0.9 %  bolus 1,000 mL (0 mLs Intravenous Stopped 02/12/24 2004)  ondansetron  (ZOFRAN ) injection 4 mg (4 mg Intravenous Given 02/12/24 1823)  morphine (PF) 4 MG/ML injection 4 mg (4 mg Intravenous Given 02/12/24 1824)  HYDROmorphone  (DILAUDID ) injection 1 mg (1 mg Intravenous Given 02/12/24 1957)  ketorolac  (TORADOL ) 30 MG/ML injection 15 mg (15 mg Intravenous Given 02/12/24 2037)  oxyCODONE -acetaminophen  (PERCOCET/ROXICET) 5-325 MG per tablet 1 tablet (1 tablet Oral Given 02/12/24 2112)     IMPRESSION / MDM / ASSESSMENT AND PLAN / ED COURSE  I reviewed the triage vital signs and the nursing notes.                              Differential diagnosis includes, but is not limited to, kidney stone, UTI, pyelonephritis, diverticulitis, ovarian cyst  Patient's presentation is most consistent with acute illness / injury with system symptoms.   Cardiac monitor no Medications given: Saline 1 L IV, morphine 4 mg IV, Zofran  4 mg IV  Labs are reassuring except for WBC mildly elevated at 13, potassium also decreased at 2.8   Urinalysis has large amount of hemoglobin, no bacteria.  CT renal stone study was independently reviewed interpreted by me as having a 3 mm stone that is at the proximal ureter,  radiologist comments proximal obstructing.  However there is no fat stranding to indicate infection  I did explain the findings to the patient.  She was given Dilaudid  as the morphine was not helping.  Toradol  15 mg IV.  She was given a prescription for Percocet, Flomax , Toradol , and potassium as her potassium was low here in the ED.  She is also given a prescription for Keflex to prevent infection as she is diabetic.  Patient is in agreement with this treatment plan.  She was discharged stable condition.  Do not feel that she would benefit from admission at this time.  Her pain is under control and her vitals have been stable.   FINAL CLINICAL IMPRESSION(S) / ED DIAGNOSES   Final diagnoses:  Kidney stone     Rx / DC Orders   ED Discharge Orders          Ordered    oxyCODONE -acetaminophen  (PERCOCET) 5-325 MG tablet  Every 4 hours PRN        02/12/24 2111    tamsulosin  (FLOMAX ) 0.4 MG CAPS capsule  Daily        02/12/24 2111    ketorolac  (TORADOL ) 10 MG tablet  Every 6 hours PRN        02/12/24 2111    cephALEXin (KEFLEX) 500 MG capsule  3 times daily        02/12/24 2116    potassium chloride SA (KLOR-CON M) 20 MEQ tablet  2 times daily        02/12/24 2117             Note:  This document was prepared using Dragon voice recognition software and may include unintentional dictation errors.    Gasper Devere ORN, PA-C 02/12/24 2119    Waymond Lorelle Cummins, MD 02/15/24 [phone]

## 2024-02-12 NOTE — ED Notes (Signed)
 ED Provider at bedside.

## 2024-02-13 ENCOUNTER — Encounter: Payer: Self-pay | Admitting: Emergency Medicine

## 2024-02-13 ENCOUNTER — Emergency Department
Admission: EM | Admit: 2024-02-13 | Discharge: 2024-02-13 | Disposition: A | Discharge status: Home / Self Care | Attending: Emergency Medicine | Admitting: Emergency Medicine

## 2024-02-13 DIAGNOSIS — R109 Unspecified abdominal pain: Secondary | ICD-10-CM | POA: Insufficient documentation

## 2024-02-13 DIAGNOSIS — E876 Hypokalemia: Secondary | ICD-10-CM | POA: Diagnosis not present

## 2024-02-13 DIAGNOSIS — R112 Nausea with vomiting, unspecified: Secondary | ICD-10-CM | POA: Insufficient documentation

## 2024-02-13 LAB — URINALYSIS, W/ REFLEX TO CULTURE (INFECTION SUSPECTED)
Bilirubin Urine: NEGATIVE
Glucose, UA: NEGATIVE mg/dL
Ketones, ur: 5 mg/dL — AB
Leukocytes,Ua: NEGATIVE
Nitrite: NEGATIVE
Protein, ur: NEGATIVE mg/dL
Specific Gravity, Urine: 1.021 (ref 1.005–1.030)
pH: 6 (ref 5.0–8.0)

## 2024-02-13 LAB — CBC
HCT: 35.2 % — ABNORMAL LOW (ref 36.0–46.0)
Hemoglobin: 12 g/dL (ref 12.0–15.0)
MCH: 29.3 pg (ref 26.0–34.0)
MCHC: 34.1 g/dL (ref 30.0–36.0)
MCV: 85.9 fL (ref 80.0–100.0)
Platelets: 338 10*3/uL (ref 150–400)
RBC: 4.1 MIL/uL (ref 3.87–5.11)
RDW: 13.3 % (ref 11.5–15.5)
WBC: 16.1 10*3/uL — ABNORMAL HIGH (ref 4.0–10.5)
nRBC: 0 % (ref 0.0–0.2)

## 2024-02-13 LAB — BASIC METABOLIC PANEL WITH GFR
Anion gap: 14 (ref 5–15)
BUN: 7 mg/dL (ref 6–20)
CO2: 26 mmol/L (ref 22–32)
Calcium: 8.4 mg/dL — ABNORMAL LOW (ref 8.9–10.3)
Chloride: 98 mmol/L (ref 98–111)
Creatinine, Ser: 0.94 mg/dL (ref 0.44–1.00)
GFR, Estimated: >60 mL/min (ref >60)
Glucose, Bld: 119 mg/dL — ABNORMAL HIGH (ref 70–99)
Potassium: 2.4 mmol/L — CL (ref 3.5–5.1)
Sodium: 138 mmol/L (ref 135–145)

## 2024-02-13 LAB — MAGNESIUM: Magnesium: 1.9 mg/dL (ref 1.7–2.4)

## 2024-02-13 MED ORDER — ONDANSETRON HCL 4 MG/2ML IJ SOLN
4.0000 mg | Freq: Once | INTRAMUSCULAR | Status: AC
  Administered 2024-02-13: 4 mg via INTRAVENOUS
  Filled 2024-02-13: qty 2

## 2024-02-13 MED ORDER — ONDANSETRON HCL 4 MG/2ML IJ SOLN
4.0000 mg | Freq: Once | INTRAMUSCULAR | Status: AC
Start: 2024-02-13 — End: 2024-02-13
  Administered 2024-02-13: 4 mg via INTRAVENOUS
  Filled 2024-02-13: qty 2

## 2024-02-13 MED ORDER — POTASSIUM CHLORIDE CRYS ER 20 MEQ PO TBCR: 40.0000 meq | EXTENDED_RELEASE_TABLET | Freq: Two times a day (BID) | ORAL | 0 refills | Status: DC

## 2024-02-13 MED ORDER — KETOROLAC TROMETHAMINE 15 MG/ML IJ SOLN
15.0000 mg | Freq: Once | INTRAMUSCULAR | Status: AC
  Administered 2024-02-13: 15 mg via INTRAVENOUS
  Filled 2024-02-13: qty 1

## 2024-02-13 MED ORDER — SODIUM CHLORIDE 0.9 % IV BOLUS
500.0000 mL | Freq: Once | INTRAVENOUS | Status: AC
  Administered 2024-02-13: 500 mL via INTRAVENOUS

## 2024-02-13 MED ORDER — MORPHINE SULFATE (PF) 4 MG/ML IV SOLN
6.0000 mg | Freq: Once | INTRAVENOUS | Status: AC
  Administered 2024-02-13: 6 mg via INTRAVENOUS
  Filled 2024-02-13: qty 2

## 2024-02-13 MED ORDER — ONDANSETRON 4 MG PO TBDP: 4.0000 mg | ORAL_TABLET | Freq: Three times a day (TID) | ORAL | 0 refills | Status: DC | PRN

## 2024-02-13 MED ORDER — POTASSIUM CHLORIDE 10 MEQ/100ML IV SOLN
10.0000 meq | Freq: Once | INTRAVENOUS | Status: AC
  Administered 2024-02-13: 10 meq via INTRAVENOUS
  Filled 2024-02-13: qty 100

## 2024-02-13 MED ORDER — POTASSIUM CHLORIDE CRYS ER 20 MEQ PO TBCR
40.0000 meq | EXTENDED_RELEASE_TABLET | Freq: Once | ORAL | Status: AC
  Administered 2024-02-13: 40 meq via ORAL
  Filled 2024-02-13: qty 2

## 2024-02-13 NOTE — ED Notes (Signed)
 See triage note  Presents with n/v  States she is having left flank pain  was seen yesterday  Dx'd with renal stone Was given pain meds on discharge But no meds for nausea  Afebrile on arrival

## 2024-02-13 NOTE — Discharge Instructions (Addendum)
 You are seen in the emergency department for nausea and vomiting.  You are given nausea medication and pain medication with improved your symptoms.  You had no further episodes of vomiting while in the emergency department and were able to keep down some food and liquids.  Your potassium was very low when checked today.  You are given 40 mill equivalents of potassium replacement today.  You are given a prescription to replete your potassium to other times today.  Make sure to follow up with a primary doctor to follow up your labs -you need labs rechecked this week.  Make sure to eat food high in potassium and magnesium - examples - potatoes, spinach, bananas, beans, avocadoes, oranges, nuts.  You are given information to follow-up with urology.  Return to the emergency department if you have other further episodes of vomiting or if you are unable to keep down your potassium.

## 2024-02-13 NOTE — ED Triage Notes (Signed)
 Patient to ED via POV for left sided flank pain. Seen for same yesterday- dx with kidney stone. C/o of emesis- states unable to keep pain medication down due to same. States she was not given a prescription for nausea. NAD noted.

## 2024-02-13 NOTE — ED Provider Notes (Signed)
 [facility] Provider Note    Event Date/Time   First MD Initiated Contact with Patient 02/13/24 [phone]     (approximate)   History   Flank Pain   HPI  Tracy Mason is a 48 y.o. female past medical history significant for kidney stone who presents to the emergency department with nausea and vomiting.  Patient was evaluated yesterday in the emergency department and diagnosed with an obstructing kidney stone.  Discharged home with pain medication.  Multiple episodes of vomiting since that time and not being able to keep down any of her pain medication.  Currently rates her pain under 9.  Denies fever or chills.  No dysuria.  States that she otherwise does not feel sick just not able to keep down her pain medications.  History of kidney stones in the past.  On chart review patient was diagnosed with an obstructing 3 mm kidney stone on the left side.  Had hypokalemia at that time.     Physical Exam   Triage Vital Signs: ED Triage Vitals [02/13/24 0752]  Encounter Vitals Group     BP (!) 160/96     Girls Systolic BP Percentile      Girls Diastolic BP Percentile      Boys Systolic BP Percentile      Boys Diastolic BP Percentile      Pulse Rate 87     Resp 18     Temp 98.3 F (36.8 C)     Temp Source Oral     SpO2 99 %     Weight 165 lb (74.8 kg)     Height 5' 2 (1.575 m)     Head Circumference      Peak Flow      Pain Score 9     Pain Loc      Pain Education      Exclude from Growth Chart     Most recent vital signs: Vitals:   02/13/24 0752 02/13/24 0946  BP: (!) 160/96 (!) 146/94  Pulse: 87 85  Resp: 18 16  Temp: 98.3 F (36.8 C)   SpO2: 99% 99%    Physical Exam Constitutional:      Appearance: She is well-developed.  HENT:     Head: Atraumatic.   Eyes:     Conjunctiva/sclera: Conjunctivae normal.    Cardiovascular:     Rate and Rhythm: Regular rhythm.  Pulmonary:     Effort: No respiratory distress.  Abdominal:      General: There is no distension.     Tenderness: There is left CVA tenderness.   Musculoskeletal:        General: Normal range of motion.     Cervical back: Normal range of motion.     Right lower leg: No edema.     Left lower leg: No edema.   Skin:    General: Skin is warm.     Capillary Refill: Capillary refill takes less than 2 seconds.   Neurological:     Mental Status: She is alert. Mental status is at baseline.     IMPRESSION / MDM / ASSESSMENT AND PLAN / ED COURSE  I reviewed the triage vital signs and the nursing notes.  Differential diagnosis including nausea and vomiting for medication side effect, sequela of kidney stone, dehydration, electrolyte abnormality.  Have low suspicion for pyelonephritis or infected kidney stone.  On chart review patient is on Keflex.  Given her recent hypokalemia will repeat lab  work and give IV antiemetics and a bolus of fluids.  Do not feel that repeat imaging is necessary at this time  LABS (all labs ordered are listed, but only abnormal results are displayed) Labs interpreted as -    Labs Reviewed  CBC - Abnormal; Notable for the following components:      Result Value   WBC 16.1 (*)    HCT 35.2 (*)    All other components within normal limits  BASIC METABOLIC PANEL WITH GFR - Abnormal; Notable for the following components:   Potassium 2.4 (*)    Glucose, Bld 119 (*)    Calcium 8.4 (*)    All other components within normal limits  URINALYSIS, W/ REFLEX TO CULTURE (INFECTION SUSPECTED) - Abnormal; Notable for the following components:   Color, Urine YELLOW (*)    APPearance HAZY (*)    Hgb urine dipstick MODERATE (*)    Ketones, ur 5 (*)    Bacteria, UA RARE (*)    All other components within normal limits  MAGNESIUM     MDM On reevaluation patient states she is feeling much better.  Able to tolerate p.o. and no longer with any nausea.  Keeping down oral hydration and crackers in the emergency department.  Repeat abdominal  exam continues to be benign with no abdominal tenderness to palpation and no rebound or guarding.  No fever or chills.  Have a low suspicion for infected kidney stone.  Leukocytosis likely in the setting of vomiting.  I have a low suspicion for sepsis.  Down oral potassium and IV potassium in the emergency department.  Magnesium level within normal limits.  Not on any potassium depleting medications at home.  Discussed close follow-up this week with primary care physician to repeat lab work specifically electrolytes.  No findings of urinary tract infection.  Creatinine at baseline.  Discussed admission versus discharge home.  Given that the patient is tolerating p.o. wants to do a trial at home of replating her potassium.  States that if she had any return of symptoms or if unable to keep down her potassium supplements would return to the emergency department.   Given information to follow-up with urology as an outpatient.  Discussed close follow-up with primary care physician and rechecking her blood pressure to further reevaluate for hypertension, likely in the setting of pain.   PROCEDURES:  Critical Care performed: No  Procedures  Patient's presentation is most consistent with acute presentation with potential threat to life or bodily function.   MEDICATIONS ORDERED IN ED: Medications  ondansetron  (ZOFRAN ) injection 4 mg (4 mg Intravenous Given 02/13/24 0833)  sodium chloride  0.9 % bolus 500 mL (0 mLs Intravenous Stopped 02/13/24 0959)  ketorolac  (TORADOL ) 15 MG/ML injection 15 mg (15 mg Intravenous Given 02/13/24 0832)  potassium chloride SA (KLOR-CON M) CR tablet 40 mEq (40 mEq Oral Given 02/13/24 0943)  potassium chloride 10 mEq in 100 mL IVPB (0 mEq Intravenous Stopped 02/13/24 1054)  morphine (PF) 4 MG/ML injection 6 mg (6 mg Intravenous Given 02/13/24 0941)  ondansetron  (ZOFRAN ) injection 4 mg (4 mg Intravenous Given 02/13/24 0940)  sodium chloride  0.9 % bolus 500 mL (500 mLs  Intravenous New Bag/Given 02/13/24 0958)    FINAL CLINICAL IMPRESSION(S) / ED DIAGNOSES   Final diagnoses:  Flank pain  Nausea and vomiting, unspecified vomiting type  Hypokalemia     Rx / DC Orders   ED Discharge Orders          Ordered  ondansetron  (ZOFRAN -ODT) 4 MG disintegrating tablet  Every 8 hours PRN        02/13/24 0808    potassium chloride SA (KLOR-CON M) 20 MEQ tablet  2 times daily        02/13/24 1112             Note:  This document was prepared using Dragon voice recognition software and may include unintentional dictation errors.   Suzanne Kirsch, MD 02/13/24 1136

## 2024-02-21 ENCOUNTER — Other Ambulatory Visit: Payer: Self-pay | Admitting: Urology

## 2024-02-22 ENCOUNTER — Encounter (HOSPITAL_COMMUNITY): Payer: Self-pay | Admitting: Urology

## 2024-02-22 NOTE — Progress Notes (Signed)
 Left voicemail to call  [phone] today up until 1:30pm and I will also try to call back

## 2024-02-22 NOTE — Progress Notes (Signed)
 Spoke w/ via phone for pre-op interview---Patient Lab needs dos---- KUB        Lab results------ COVID test ---Not indicated--patient states asymptomatic no test needed Arrive at -------0800 NPO after MN ure or G2:  Med rec completed Medications to take morning of surgery ----- Diabetic medication -----  GLP1 agonist last dose: Sunday 02/19/2024 GLP1 instructions:  Patient instructed no nail polish to be worn day of surgery Patient instructed to bring photo id and insurance card day of surgery Patient aware to have Driver (ride ) / caregiver    for 24 hours after surgery - spouseGLENWOOD Boas [phone] Patient Special Instructions -----Bring blue folder, on Sunday, take laxative of choice, drink plenty of liquids, eat light meal that evening, do not take vitamins, supplements, NSAIDS or pepto bismol after Friday Pre-Op special Instructions -----per [facility]  Patient verbalized understanding of instructions that were given at this phone interview. Patient denies chest pain, sob, fever, cough at the interview.

## 2024-02-27 ENCOUNTER — Encounter (HOSPITAL_COMMUNITY): Admission: RE | Payer: Self-pay | Source: Home / Self Care

## 2024-02-27 ENCOUNTER — Ambulatory Visit (HOSPITAL_COMMUNITY): Admission: RE | Admit: 2024-02-27 | Source: Home / Self Care | Admitting: Urology

## 2024-02-27 SURGERY — LITHOTRIPSY, ESWL
Anesthesia: LOCAL | Laterality: Left

## 2024-06-14 ENCOUNTER — Other Ambulatory Visit: Payer: Self-pay

## 2024-06-14 ENCOUNTER — Emergency Department
Admission: EM | Admit: 2024-06-14 | Discharge: 2024-06-14 | Disposition: A | Discharge status: Home / Self Care | Attending: Emergency Medicine | Admitting: Emergency Medicine

## 2024-06-14 ENCOUNTER — Encounter: Payer: Self-pay | Admitting: Emergency Medicine

## 2024-06-14 ENCOUNTER — Emergency Department

## 2024-06-14 DIAGNOSIS — R112 Nausea with vomiting, unspecified: Secondary | ICD-10-CM | POA: Insufficient documentation

## 2024-06-14 DIAGNOSIS — R350 Frequency of micturition: Secondary | ICD-10-CM | POA: Insufficient documentation

## 2024-06-14 DIAGNOSIS — N200 Calculus of kidney: Secondary | ICD-10-CM

## 2024-06-14 DIAGNOSIS — E876 Hypokalemia: Secondary | ICD-10-CM | POA: Insufficient documentation

## 2024-06-14 DIAGNOSIS — R10A2 Flank pain, left side: Secondary | ICD-10-CM | POA: Insufficient documentation

## 2024-06-14 LAB — CBC WITH DIFFERENTIAL/PLATELET
Abs Immature Granulocytes: 0.05 K/uL (ref 0.00–0.07)
Basophils Absolute: 0.1 K/uL (ref 0.0–0.1)
Basophils Relative: 1 %
Eosinophils Absolute: 0 K/uL (ref 0.0–0.5)
Eosinophils Relative: 0 %
HCT: 38.1 % (ref 36.0–46.0)
Hemoglobin: 13.1 g/dL (ref 12.0–15.0)
Immature Granulocytes: 0 %
Lymphocytes Relative: 21 %
Lymphs Abs: 2.7 K/uL (ref 0.7–4.0)
MCH: 29.7 pg (ref 26.0–34.0)
MCHC: 34.4 g/dL (ref 30.0–36.0)
MCV: 86.4 fL (ref 80.0–100.0)
Monocytes Absolute: 0.9 K/uL (ref 0.1–1.0)
Monocytes Relative: 7 %
Neutro Abs: 9.2 K/uL — ABNORMAL HIGH (ref 1.7–7.7)
Neutrophils Relative %: 71 %
Platelets: 396 K/uL (ref 150–400)
RBC: 4.41 MIL/uL (ref 3.87–5.11)
RDW: 13.2 % (ref 11.5–15.5)
WBC: 13 K/uL — ABNORMAL HIGH (ref 4.0–10.5)
nRBC: 0 % (ref 0.0–0.2)

## 2024-06-14 LAB — COMPREHENSIVE METABOLIC PANEL WITH GFR
ALT: 38 U/L (ref 0–44)
AST: 107 U/L — ABNORMAL HIGH (ref 15–41)
Albumin: 3.7 g/dL (ref 3.5–5.0)
Alkaline Phosphatase: 112 U/L (ref 38–126)
Anion gap: 15 (ref 5–15)
BUN: 7 mg/dL (ref 6–20)
CO2: 31 mmol/L (ref 22–32)
Calcium: 9 mg/dL (ref 8.9–10.3)
Chloride: 93 mmol/L — ABNORMAL LOW (ref 98–111)
Creatinine, Ser: 0.71 mg/dL (ref 0.44–1.00)
GFR, Estimated: >60 mL/min (ref >60)
Glucose, Bld: 134 mg/dL — ABNORMAL HIGH (ref 70–99)
Potassium: 2.4 mmol/L — CL (ref 3.5–5.1)
Sodium: 139 mmol/L (ref 135–145)
Total Bilirubin: 0.8 mg/dL (ref 0.0–1.2)
Total Protein: 7.7 g/dL (ref 6.5–8.1)

## 2024-06-14 LAB — MAGNESIUM: Magnesium: 2.2 mg/dL (ref 1.7–2.4)

## 2024-06-14 LAB — URINALYSIS, ROUTINE W REFLEX MICROSCOPIC
Bacteria, UA: NONE SEEN
Bilirubin Urine: NEGATIVE
Glucose, UA: NEGATIVE mg/dL
Ketones, ur: NEGATIVE mg/dL
Leukocytes,Ua: NEGATIVE
Nitrite: NEGATIVE
Protein, ur: 30 mg/dL — AB
Specific Gravity, Urine: 1.016 (ref 1.005–1.030)
pH: 6 (ref 5.0–8.0)

## 2024-06-14 LAB — PREGNANCY, URINE: Preg Test, Ur: NEGATIVE

## 2024-06-14 MED ORDER — MORPHINE SULFATE (PF) 4 MG/ML IV SOLN
4.0000 mg | Freq: Once | INTRAVENOUS | Status: AC
  Administered 2024-06-14: 4 mg via INTRAVENOUS
  Filled 2024-06-14: qty 1

## 2024-06-14 MED ORDER — POTASSIUM CHLORIDE CRYS ER 20 MEQ PO TBCR
40.0000 meq | EXTENDED_RELEASE_TABLET | Freq: Once | ORAL | Status: AC
Start: 2024-06-14 — End: 2024-06-14
  Administered 2024-06-14: 40 meq via ORAL
  Filled 2024-06-14: qty 2

## 2024-06-14 MED ORDER — POTASSIUM CITRATE ER 10 MEQ (1080 MG) PO TBCR: 10.0000 meq | EXTENDED_RELEASE_TABLET | Freq: Three times a day (TID) | ORAL | 0 refills | Status: AC

## 2024-06-14 MED ORDER — KETOROLAC TROMETHAMINE 10 MG PO TABS: 10.0000 mg | ORAL_TABLET | Freq: Four times a day (QID) | ORAL | 0 refills | Status: DC | PRN

## 2024-06-14 MED ORDER — ONDANSETRON HCL 4 MG/2ML IJ SOLN
4.0000 mg | INTRAMUSCULAR | Status: AC
  Administered 2024-06-14: 4 mg via INTRAVENOUS
  Filled 2024-06-14: qty 2

## 2024-06-14 MED ORDER — POTASSIUM CITRATE ER 10 MEQ (1080 MG) PO TBCR
10.0000 meq | EXTENDED_RELEASE_TABLET | Freq: Once | ORAL | Status: AC
  Administered 2024-06-14: 10 meq via ORAL
  Filled 2024-06-14: qty 1

## 2024-06-14 MED ORDER — SODIUM CHLORIDE 0.9 % IV BOLUS
500.0000 mL | Freq: Once | INTRAVENOUS | Status: AC
  Administered 2024-06-14: 500 mL via INTRAVENOUS

## 2024-06-14 MED ORDER — SODIUM CHLORIDE 0.9 % IV BOLUS
1000.0000 mL | Freq: Once | INTRAVENOUS | Status: AC
  Administered 2024-06-14: 1000 mL via INTRAVENOUS

## 2024-06-14 MED ORDER — POTASSIUM CHLORIDE 10 MEQ/100ML IV SOLN
10.0000 meq | Freq: Once | INTRAVENOUS | Status: AC
  Administered 2024-06-14: 10 meq via INTRAVENOUS
  Filled 2024-06-14: qty 100

## 2024-06-14 MED ORDER — ONDANSETRON 4 MG PO TBDP: 4.0000 mg | ORAL_TABLET | Freq: Three times a day (TID) | ORAL | 0 refills | Status: DC | PRN

## 2024-06-14 MED ORDER — KETOROLAC TROMETHAMINE 30 MG/ML IJ SOLN
15.0000 mg | Freq: Once | INTRAMUSCULAR | Status: AC
  Administered 2024-06-14: 15 mg via INTRAVENOUS
  Filled 2024-06-14: qty 1

## 2024-06-14 MED ORDER — OXYCODONE-ACETAMINOPHEN 5-325 MG PO TABS: 1.0000 | ORAL_TABLET | ORAL | 0 refills | Status: AC | PRN

## 2024-06-14 MED ORDER — OXYCODONE-ACETAMINOPHEN 5-325 MG PO TABS
1.0000 | ORAL_TABLET | Freq: Once | ORAL | Status: AC
  Administered 2024-06-14: 1 via ORAL
  Filled 2024-06-14: qty 1

## 2024-06-14 NOTE — ED Provider Notes (Signed)
 Clinical Course as of 06/14/24 0802  Thu Jun 14, 2024                 0651 Received signout. Left side pain. History of kidney stones. Has a urologist in [place]. Hematuria. [ ]  fluids reassess [HD]  9345 Transferred ED care to Dr. Nicholaus. [CF]  0800 Patient states that pain has improved but she is worried that pain will return.  I went and evaluated patient and patient's abdomen is benign at this time.  We reviewed the results and I printed out the CT scan.  Patient currently has a urologist that she saw on Monday with [facility] in [place].  I reviewed the indications benefits risks and alternatives of calling our urologist in the [facility] system for possible intervention and she voiced understanding but at this time would prefer this to be managed without procedures and feels comfortable following up with her primary care doctor and [facility] urologist.  Patient was counseled on return precautions and warning signs.  Will give her 1.5 of fluids give her Percocet and potassium citrate.  She patient is already on Flomax  which she took yesterday evening.  Will plan for discharge afterwards [HD]    Clinical Course User Index [CF] Gordan Huxley, MD [HD] Nicholaus Rolland BRAVO, MD   At time of discharge there is no evidence of acute life, limb, vision, or fertility threat. Patient has stable vital signs, pain is well controlled, patient is ambulatory and p.o. tolerant.  Discharge instructions were completed using the EPIC system. I would refer you to those at this time. All warnings prescriptions follow-up etc. were discussed in detail with the patient. Patient indicates understanding and is agreeable with this plan. All questions answered.  Patient is made aware that they may return to the emergency department for any worsening or new condition or for any other emergency.    Nicholaus Rolland BRAVO, MD 06/14/24 [phone]

## 2024-06-14 NOTE — ED Notes (Signed)
 Pt is Aox4, NAD at this time. Pt stating some pain from IV site. NS rate increased to mitigate pain from potasium infusion. Pt denies any needs at this time.

## 2024-06-14 NOTE — ED Notes (Signed)
 0.9% NS running at 10mL/hr with potassium per order guidelines to prevent discomfort.

## 2024-06-14 NOTE — ED Provider Notes (Signed)
 [facility] Provider Note    Event Date/Time   First MD Initiated Contact with Patient 06/14/24 [phone]     (approximate)   History   Flank Pain   HPI Tracy Mason is a 48 y.o. female with a history of kidney stones requiring lithotripsy (years ago) who presents for evaluation of intermittent left-sided flank pain for about 4 to 5 days.  She said it feels like prior kidney stones.  It has been intermittently accompanied with nausea and vomiting.  She went to her urologist at [facility] urology in [place] a few days ago and they are supposed to schedule an outpatient CT scan, but it has not happened yet.  Tonight the pain was worse than it has been and accompanied with nausea and vomiting so she came to the ED.  She denies fever.  She said that sometimes it hurts when she urinates but not always.  She has had increased urinary frequency.  She denies chest pain and shortness of breath.     Physical Exam   Triage Vital Signs: ED Triage Vitals  Encounter Vitals Group     BP 06/14/24 0517 (!) 162/96     Girls Systolic BP Percentile --      Girls Diastolic BP Percentile --      Boys Systolic BP Percentile --      Boys Diastolic BP Percentile --      Pulse Rate 06/14/24 0517 84     Resp 06/14/24 0517 16     Temp 06/14/24 0517 98.2 F (36.8 C)     Temp Source 06/14/24 0517 Oral     SpO2 06/14/24 0517 98 %     Weight 06/14/24 0523 74.8 kg (165 lb)     Height 06/14/24 0523 1.575 m (5' 2)     Head Circumference --      Peak Flow --      Pain Score 06/14/24 0523 10     Pain Loc --      Pain Education --      Exclude from Growth Chart --     Most recent vital signs: Vitals:   06/14/24 0517  BP: (!) 162/96  Pulse: 84  Resp: 16  Temp: 98.2 F (36.8 C)  SpO2: 98%    General: Awake, no distress at the moment. CV:  Good peripheral perfusion.  Regular rate and rhythm. Resp:  Normal effort. Speaking easily and comfortably, no accessory muscle usage nor  intercostal retractions.   Abd:  No distention.  Mild tenderness to palpation of the left side of the abdomen and left flank.   ED Results / Procedures / Treatments   Labs (all labs ordered are listed, but only abnormal results are displayed) Labs Reviewed  CBC WITH DIFFERENTIAL/PLATELET - Abnormal; Notable for the following components:      Result Value   WBC 13.0 (*)    Neutro Abs 9.2 (*)    All other components within normal limits  COMPREHENSIVE METABOLIC PANEL WITH GFR - Abnormal; Notable for the following components:   Potassium 2.4 (*)    Chloride 93 (*)    Glucose, Bld 134 (*)    AST 107 (*)    All other components within normal limits  URINALYSIS, ROUTINE W REFLEX MICROSCOPIC - Abnormal; Notable for the following components:   Color, Urine YELLOW (*)    APPearance CLEAR (*)    Hgb urine dipstick MODERATE (*)    Protein, ur 30 (*)  All other components within normal limits  MAGNESIUM  PREGNANCY, URINE     RADIOLOGY See ED course for details   PROCEDURES:  Critical Care performed: No  Procedures   ED ECG REPORT I, Darleene Dome, the attending physician, personally viewed and interpreted this ECG.  Date: 06/14/2024 EKG Time: 6:34 AM Rate: 80 Rhythm: normal sinus rhythm QRS Axis: normal Intervals: normal ST/T Wave abnormalities: normal Narrative Interpretation: no evidence of acute ischemia     IMPRESSION / MDM / ASSESSMENT AND PLAN / ED COURSE  I reviewed the triage vital signs and the nursing notes.                              Differential diagnosis includes, but is not limited to, ureteral/renal colic, UTI/pyelonephritis, musculoskeletal strain.  Patient's presentation is most consistent with acute presentation with potential threat to life or bodily function.  Labs/studies ordered: CMP, CBC with differential, urine pregnancy test, urinalysis, CT renal stone study  Interventions/Medications given:  Medications  potassium chloride  10 mEq  in 100 mL IVPB (10 mEq Intravenous New Bag/Given 06/14/24 0631)  ketorolac  (TORADOL ) 30 MG/ML injection 15 mg (15 mg Intravenous Given 06/14/24 0542)  morphine  (PF) 4 MG/ML injection 4 mg (4 mg Intravenous Given 06/14/24 0544)  ondansetron  (ZOFRAN ) injection 4 mg (4 mg Intravenous Given 06/14/24 0542)  potassium chloride  SA (KLOR-CON  M) CR tablet 40 mEq (40 mEq Oral Given 06/14/24 0622)    (Note:  hospital course my include additional interventions and/or labs/studies not listed above.)   Patient is relatively comfortable right now, are at least better than before, but she still reports significant pain, so I ordered the medications as listed above including IV morphine .  Labs pending, then we will proceed with CT renal stone study.     Clinical Course as of 06/14/24 0658  Thu Jun 14, 2024  0614 Potassium(!!): 2.4 Severely depleted potassium, will order 40 mill equivalents p.o. and 10 mill equivalents IV to begin repletion.  I am also checking a magnesium level.  Labs are otherwise reassuring and unremarkable. [CF]  0627 Urinalysis, Routine w reflex microscopic -Urine, Clean Catch(!) Hemoglobinuria, no evidence of infection which is reassuring [CF]  0634 Magnesium: 2.2 [CF]  0634 Preg Test, Ur: NEGATIVE [CF]  0639 Obtained EKG given the significant hypokalemia, but the EKG is not particularly concerning [CF]  0651 Received signout. Left side pain. History of kidney stones. Has a urologist in [place]. Hematuria. [ ]  fluids reassess [HD]  9345 Transferred ED care to Dr. Nicholaus. [CF]    Clinical Course User Index [CF] Dome Darleene, MD [HD] Nicholaus Rolland BRAVO, MD     FINAL CLINICAL IMPRESSION(S) / ED DIAGNOSES   Final diagnoses:  None     Rx / DC Orders   ED Discharge Orders     None        Note:  This document was prepared using Dragon voice recognition software and may include unintentional dictation errors.   Dome Darleene, MD 06/14/24 [phone]

## 2024-06-14 NOTE — ED Notes (Signed)
 Pharmacy pt would like to use [address] [place], [facility]

## 2024-06-14 NOTE — ED Triage Notes (Signed)
 Patient ambulatory to triage with steady gait, without difficulty or distress noted; pt reports awoke at 2am with left flank pain radiating into abd accomp by nausea; attempted to take percocet but vomited; eval by urologist on Monday and awaiting CT scheduling

## 2024-06-14 NOTE — Discharge Instructions (Addendum)
 You was seen in our emergency department for left flank pain and found to have a kidney stone.  You are offered consultation and potential intervention with our [facility] urologist but preferred conservative measures and to follow-up with your [facility] urologist.  Please call your urologist and let them know the results of your CT scan.  Please call your primary care physician and arrange a potassium recheck within 3 to 5 days.  Return with any acutely worsening symptoms or any other emergency. -- RETURN PRECAUTIONS & AFTERCARE: (ENGLISH) RETURN PRECAUTIONS: Return immediately to the emergency department or see/call your doctor if you feel worse, weak or have changes in speech or vision, are short of breath, have fever, vomiting, pain, bleeding or dark stool, trouble urinating or any new issues. Return here or see/call your doctor if not improving as expected for your suspected condition. FOLLOW-UP CARE: Call your doctor and/or any doctors we referred you to for more advice and to make an appointment. Do this today, tomorrow or after the weekend. Some doctors only take PPO insurance so if you have HMO insurance you may want to contact your HMO or your regular doctor for referral to a specialist within your plan. Either way tell the doctor's office that it was a referral from the emergency department so you get the soonest possible appointment.  YOUR TEST RESULTS: Take result reports of any blood or urine tests, imaging tests and EKG's to your doctor and any referral doctor. Have any abnormal tests repeated. Your doctor or a referral doctor can let you know when this should be done. Also make sure your doctor contacts this hospital to get any test results that are not currently available such as cultures or special tests for infection and final imaging reports, which are often not available at the time you leave the ER but which may list additional important findings that are not documented on the preliminary  report. BLOOD PRESSURE: If your blood pressure was greater than 120/80 have your blood pressure rechecked within 1 to 2 weeks. MEDICATION SIDE EFFECTS: Do not drive, walk, bike, take the bus, etc. if you have received or are being prescribed any sedating medications such as those for pain or anxiety or certain antihistamines like Benadryl. If you have been give one of these here get a taxi home or have a friend drive you home. Ask your pharmacist to counsel you on potential side effects of any new medication

## 2024-06-15 ENCOUNTER — Other Ambulatory Visit: Payer: Self-pay | Admitting: Urology

## 2024-06-15 DIAGNOSIS — N201 Calculus of ureter: Secondary | ICD-10-CM

## 2024-06-20 ENCOUNTER — Other Ambulatory Visit: Payer: Self-pay | Admitting: Urology

## 2024-06-22 ENCOUNTER — Encounter (HOSPITAL_COMMUNITY): Payer: Self-pay | Admitting: Urology

## 2024-06-22 NOTE — Progress Notes (Signed)
 Attempted to obtain medical history for pre op call via telephone, unable to reach at this time. HIPAA compliant voicemail message left requesting return call to pre surgical testing department.

## 2024-06-22 NOTE — Progress Notes (Signed)
 [facility] PREOP PHONE CALL   ALLERGIES REVIEWED: YES  MEDICATION REVIEW DONE: YES MEDICATIONS THAT PT SHOULD HOLD (LIST): Ozempic hold 1 week last dose 11/2  CAN PT WALK UP STAIRS WITHOUT SHORTNESS OF BREATH: YES/NO HOME O2:NO CPAP: NO  IF YES, INFORMED PT TO BRING CPAP WITH TUBING AND MASK:YES/NO   INFORMED DRIVER NEEDED FOR PROCEDURE: YES   PT WAS GIVEN BLUE FOLDER AT UROLOGY APPT: YES PT INFORMED TO BRING BLUE FOLDER WITH ALL CONTENTS: YES  REVIEWED ARRIVAL TIME AND LOCATION: YES  OTHER PERTINENT INFORMATION: Has been diagnosed with OSA but lost weight, needed new sleep study to get new machine and hasn't been tested so she does not use CPAP

## 2024-06-23 NOTE — H&P (Signed)
 H&P  Chief Complaint: L renal stone  History of Present Illness: 48 year old female patient nephrolithiasis is a 3 to 4 mm left distal ureteral stone and a 5 mm left renal stone.  After trial of passage patient has decided to pursue ESWL.  Past Medical History:  Diagnosis Date   Diabetes mellitus without complication (HCC)    takes Ozempic   Hypertension    Kidney stones    Lower abdominal adhesions    Nephrolithiasis    Approximately 20-25 episodes   Ovarian cyst    Pelvic pain in female    Sleep apnea    Past Surgical History:  Procedure Laterality Date   APPENDECTOMY  1988   BREAST CYST EXCISION Left 1994   benign   CHOLECYSTECTOMY  2001   DILATION AND EVACUATION N/A 04/21/2017   Procedure: DILATATION AND EVACUATION;  Surgeon: Connell Davies, MD;  Location: [facility] ORS;  Service: Gynecology;  Laterality: N/A;   TUBAL LIGATION Bilateral 04/21/2017   Procedure: BILATERAL TUBAL LIGATION;  Surgeon: Connell Davies, MD;  Location: [facility] ORS;  Service: Gynecology;  Laterality: Bilateral;   UTERINE FIBROID SURGERY  2014   benign    Home Medications:  No medications prior to admission.   Allergies:  Allergies  Allergen Reactions   Biaxin [Clarithromycin] Hives   Meclizine     Family History  Problem Relation Age of Onset   Diabetes Mother    Heart disease Father    Diabetes Maternal Grandmother    Breast cancer Neg Hx    Social History:  reports that she quit smoking about 20 years ago. Her smoking use included cigarettes. She started smoking about 30 years ago. She has never used smokeless tobacco. She reports that she does not drink alcohol and does not use drugs.  ROS: A complete review of systems was performed.  All systems are negative except for pertinent findings as noted. ROS   Physical Exam:  Vital signs in last 24 hours:   General: NAD Respiratory: normal WOB on RA Cards: RRR per monitor   Laboratory Data:  No results found for this or any previous visit  (from the past 24 hours). No results found for this or any previous visit (from the past 240 hours). Creatinine: No results for input(s): CREATININE in the last 168 hours.  Impression/Assessment:  48 year old female patient nephrolithiasis is a 3 to 4 mm left distal ureteral stone and a 5 mm left renal stone.  After trial of passage patient has decided to pursue ESWL.  We discussed risks benefits and alternatives to ESWL including bleeding, arrhythmias, failure to eliminate stone and pain. Patient voiced their understanding and consent was obtained.     Plan:  Plan for L ESWL  Steffan JAYSON Pea 06/23/2024, 7:40 PM

## 2024-06-25 ENCOUNTER — Ambulatory Visit (HOSPITAL_COMMUNITY)
Admission: RE | Admit: 2024-06-25 | Discharge: 2024-06-25 | Disposition: A | Discharge status: Home / Self Care | Attending: Urology | Admitting: Urology

## 2024-06-25 ENCOUNTER — Other Ambulatory Visit: Payer: Self-pay

## 2024-06-25 ENCOUNTER — Encounter (HOSPITAL_COMMUNITY)
Admission: RE | Disposition: A | Discharge status: Home / Self Care | Payer: Self-pay | Source: Home / Self Care | Attending: Urology

## 2024-06-25 ENCOUNTER — Ambulatory Visit (HOSPITAL_COMMUNITY)

## 2024-06-25 ENCOUNTER — Encounter (HOSPITAL_COMMUNITY): Payer: Self-pay | Admitting: Urology

## 2024-06-25 DIAGNOSIS — N202 Calculus of kidney with calculus of ureter: Secondary | ICD-10-CM | POA: Insufficient documentation

## 2024-06-25 DIAGNOSIS — Z538 Procedure and treatment not carried out for other reasons: Secondary | ICD-10-CM | POA: Diagnosis not present

## 2024-06-25 DIAGNOSIS — E119 Type 2 diabetes mellitus without complications: Secondary | ICD-10-CM | POA: Diagnosis not present

## 2024-06-25 DIAGNOSIS — N201 Calculus of ureter: Secondary | ICD-10-CM | POA: Diagnosis present

## 2024-06-25 DIAGNOSIS — Z01818 Encounter for other preprocedural examination: Secondary | ICD-10-CM

## 2024-06-25 HISTORY — PX: EXTRACORPOREAL SHOCK WAVE LITHOTRIPSY: SHX1557

## 2024-06-25 LAB — POCT PREGNANCY, URINE: Preg Test, Ur: NEGATIVE

## 2024-06-25 LAB — GLUCOSE, CAPILLARY: Glucose-Capillary: 109 mg/dL — ABNORMAL HIGH (ref 70–99)

## 2024-06-25 SURGERY — LITHOTRIPSY, ESWL
Anesthesia: LOCAL | Laterality: Left

## 2024-06-25 MED ORDER — CIPROFLOXACIN HCL 500 MG PO TABS
500.0000 mg | ORAL_TABLET | ORAL | Status: AC
  Administered 2024-06-25: 500 mg via ORAL
  Filled 2024-06-25: qty 1

## 2024-06-25 MED ORDER — BISACODYL 5 MG PO TBEC: 5.0000 mg | DELAYED_RELEASE_TABLET | Freq: Every day | ORAL | Status: DC | PRN

## 2024-06-25 MED ORDER — DIPHENHYDRAMINE HCL 25 MG PO CAPS
25.0000 mg | ORAL_CAPSULE | ORAL | Status: AC
  Administered 2024-06-25: 25 mg via ORAL
  Filled 2024-06-25: qty 1

## 2024-06-25 MED ORDER — OXYCODONE HCL 5 MG PO TABS: 5.0000 mg | ORAL_TABLET | Freq: Four times a day (QID) | ORAL | 0 refills | Status: DC | PRN

## 2024-06-25 MED ORDER — SODIUM CHLORIDE 0.9 % IV SOLN: INTRAVENOUS | Status: DC

## 2024-06-25 MED ORDER — DIAZEPAM 5 MG PO TABS
10.0000 mg | ORAL_TABLET | ORAL | Status: AC
  Administered 2024-06-25: 10 mg via ORAL
  Filled 2024-06-25: qty 2

## 2024-06-25 MED ORDER — TAMSULOSIN HCL 0.4 MG PO CAPS: 0.4000 mg | ORAL_CAPSULE | Freq: Every day | ORAL | 0 refills | Status: DC

## 2024-06-25 NOTE — Discharge Instructions (Signed)
 ESWL was not completed due inability to visualize stone.

## 2024-06-25 NOTE — Progress Notes (Signed)
 Surgery was canceled today due to inability to visualize stone patient was given contrast as well as multiple x-rays were taken and location of stone was unable to be obntained. Contrast did reach the bladder. Due to inablilty to identify stone will plan for CT in office. The office will reach out to the patient once CT has been completed. This plan was explained to the patient.

## 2024-06-26 ENCOUNTER — Encounter (HOSPITAL_COMMUNITY): Payer: Self-pay | Admitting: Urology

## 2024-06-27 ENCOUNTER — Other Ambulatory Visit: Payer: Self-pay | Admitting: Obstetrics and Gynecology

## 2024-06-27 DIAGNOSIS — Z1231 Encounter for screening mammogram for malignant neoplasm of breast: Secondary | ICD-10-CM

## 2024-07-11 ENCOUNTER — Encounter

## 2024-08-27 ENCOUNTER — Ambulatory Visit
Admission: RE | Admit: 2024-08-27 | Discharge: 2024-08-27 | Disposition: A | Discharge status: Home / Self Care | Source: Ambulatory Visit | Attending: Obstetrics and Gynecology | Admitting: Obstetrics and Gynecology

## 2024-08-27 DIAGNOSIS — Z1231 Encounter for screening mammogram for malignant neoplasm of breast: Secondary | ICD-10-CM | POA: Insufficient documentation

## 2024-08-30 ENCOUNTER — Other Ambulatory Visit: Payer: Self-pay

## 2024-08-30 ENCOUNTER — Emergency Department

## 2024-08-30 ENCOUNTER — Emergency Department
Admission: EM | Admit: 2024-08-30 | Discharge: 2024-08-30 | Disposition: A | Discharge status: Home / Self Care | Attending: Emergency Medicine | Admitting: Emergency Medicine

## 2024-08-30 DIAGNOSIS — N200 Calculus of kidney: Secondary | ICD-10-CM | POA: Insufficient documentation

## 2024-08-30 DIAGNOSIS — E119 Type 2 diabetes mellitus without complications: Secondary | ICD-10-CM | POA: Insufficient documentation

## 2024-08-30 DIAGNOSIS — I10 Essential (primary) hypertension: Secondary | ICD-10-CM | POA: Insufficient documentation

## 2024-08-30 DIAGNOSIS — R10A2 Flank pain, left side: Secondary | ICD-10-CM

## 2024-08-30 DIAGNOSIS — R110 Nausea: Secondary | ICD-10-CM | POA: Diagnosis present

## 2024-08-30 LAB — BASIC METABOLIC PANEL WITH GFR
Anion gap: 19 — ABNORMAL HIGH (ref 5–15)
BUN: 6 mg/dL (ref 6–20)
CO2: 26 mmol/L (ref 22–32)
Calcium: 9.7 mg/dL (ref 8.9–10.3)
Chloride: 94 mmol/L — ABNORMAL LOW (ref 98–111)
Creatinine, Ser: 0.78 mg/dL (ref 0.44–1.00)
GFR, Estimated: >60 mL/min
Glucose, Bld: 129 mg/dL — ABNORMAL HIGH (ref 70–99)
Potassium: 2.6 mmol/L — CL (ref 3.5–5.1)
Sodium: 139 mmol/L (ref 135–145)

## 2024-08-30 LAB — URINALYSIS, ROUTINE W REFLEX MICROSCOPIC
Bilirubin Urine: NEGATIVE
Glucose, UA: NEGATIVE mg/dL
Ketones, ur: 5 mg/dL — AB
Leukocytes,Ua: NEGATIVE
Nitrite: NEGATIVE
Protein, ur: 30 mg/dL — AB
Specific Gravity, Urine: 1.019 (ref 1.005–1.030)
pH: 5 (ref 5.0–8.0)

## 2024-08-30 LAB — CBC
HCT: 39.9 % (ref 36.0–46.0)
Hemoglobin: 13.9 g/dL (ref 12.0–15.0)
MCH: 30.4 pg (ref 26.0–34.0)
MCHC: 34.8 g/dL (ref 30.0–36.0)
MCV: 87.3 fL (ref 80.0–100.0)
Platelets: 328 K/uL (ref 150–400)
RBC: 4.57 MIL/uL (ref 3.87–5.11)
RDW: 14.7 % (ref 11.5–15.5)
WBC: 11.2 K/uL — ABNORMAL HIGH (ref 4.0–10.5)
nRBC: 0 % (ref 0.0–0.2)

## 2024-08-30 LAB — MAGNESIUM: Magnesium: 2.1 mg/dL (ref 1.7–2.4)

## 2024-08-30 LAB — PHOSPHORUS: Phosphorus: 3.2 mg/dL (ref 2.5–4.6)

## 2024-08-30 LAB — PREGNANCY, URINE: Preg Test, Ur: NEGATIVE

## 2024-08-30 MED ORDER — TAMSULOSIN HCL 0.4 MG PO CAPS
0.4000 mg | ORAL_CAPSULE | Freq: Once | ORAL | Status: AC
  Administered 2024-08-30: 0.4 mg via ORAL
  Filled 2024-08-30: qty 1

## 2024-08-30 MED ORDER — TAMSULOSIN HCL 0.4 MG PO CAPS: 0.4000 mg | ORAL_CAPSULE | Freq: Every day | ORAL | 0 refills | Status: AC

## 2024-08-30 MED ORDER — OXYCODONE HCL 5 MG PO TABS: 5.0000 mg | ORAL_TABLET | Freq: Three times a day (TID) | ORAL | 0 refills | Status: AC | PRN

## 2024-08-30 MED ORDER — POTASSIUM CHLORIDE CRYS ER 20 MEQ PO TBCR
40.0000 meq | EXTENDED_RELEASE_TABLET | Freq: Once | ORAL | Status: AC
  Administered 2024-08-30: 40 meq via ORAL
  Filled 2024-08-30: qty 2

## 2024-08-30 MED ORDER — POTASSIUM CHLORIDE 10 MEQ/100ML IV SOLN
10.0000 meq | Freq: Once | INTRAVENOUS | Status: AC
  Administered 2024-08-30: 10 meq via INTRAVENOUS
  Filled 2024-08-30: qty 100

## 2024-08-30 MED ORDER — MORPHINE SULFATE (PF) 4 MG/ML IV SOLN
4.0000 mg | Freq: Once | INTRAVENOUS | Status: AC
  Administered 2024-08-30: 4 mg via INTRAVENOUS
  Filled 2024-08-30: qty 1

## 2024-08-30 NOTE — Discharge Instructions (Addendum)
 Please make sure to follow-up with urology for further management of your kidney stone.  You can take Tylenol  or ibuprofen  every 6 hours as needed for pain.  Please reserve the oxycodone  for severe breakthrough pain, please not drive while on the oxycodone  since it can be sedating.

## 2024-08-30 NOTE — ED Provider Notes (Signed)
----------------------------------------- °  3:12 PM on 08/30/2024 -----------------------------------------  Blood pressure (!) 153/100, pulse 96, temperature 97.7 F (36.5 C), temperature source Oral, resp. rate 18, height 5' 2 (1.575 m), weight 70.3 kg, last menstrual period 08/04/2024, SpO2 95%.  Assuming care from Dr. Waymond.  In short, Tracy Mason is a 49 y.o. female with a chief complaint of flank pain.  Refer to the original H&P for additional details.  The current plan of care is to follow-up UA for flank pain and kidney stone.  ----------------------------------------- 4:49 PM on 08/30/2024 ----------------------------------------- Urinalysis with mild elevation in WBCs and rare bacteria, but otherwise no findings concerning for infection.  We will send urine for culture but low suspicion for infection at this time as patient denies any concerning symptoms.  She has minimal pain on reassessment and is appropriate for discharge home with outpatient urology follow-up, was counseled to return to the ED for new or worsening symptoms.  Patient agrees with plan.   Medications  potassium chloride  SA (KLOR-CON  M) CR tablet 40 mEq (40 mEq Oral Given 08/30/24 1404)  potassium chloride  10 mEq in 100 mL IVPB (0 mEq Intravenous Stopped 08/30/24 1603)  morphine  (PF) 4 MG/ML injection 4 mg (4 mg Intravenous Given 08/30/24 1508)  tamsulosin  (FLOMAX ) capsule 0.4 mg (0.4 mg Oral Given by Other 08/30/24 1521)  potassium chloride  SA (KLOR-CON  M) CR tablet 40 mEq (40 mEq Oral Given 08/30/24 1618)     ED Discharge Orders          Ordered    tamsulosin  (FLOMAX ) 0.4 MG CAPS capsule  Daily        08/30/24 1518    oxyCODONE  (ROXICODONE ) 5 MG immediate release tablet  Every 8 hours PRN        08/30/24 1524           Final diagnoses:  Left flank pain  Kidney stone      Willo Dunnings, MD 08/30/24 1650

## 2024-08-30 NOTE — ED Provider Notes (Signed)
 [facility] Provider Note    Event Date/Time   First MD Initiated Contact with Patient 08/30/24 1403     (approximate)   History   Flank Pain   HPI  Tracy Mason is a 49 y.o. female with history of diabetes, hypertension, nephrolithiasis, presenting with flank pain.  This morning, associate with nausea.  No diarrhea, denies dysuria.  No hematuria.  States that pain feels similar to prior kidney stones.  No fever.  On independent chart review, she was seen by urology in November, had a planned lithotripsy but surgery was canceled due to inability to visualize stone.     Physical Exam   Triage Vital Signs: ED Triage Vitals [08/30/24 1157]  Encounter Vitals Group     BP (!) 141/94     Girls Systolic BP Percentile      Girls Diastolic BP Percentile      Boys Systolic BP Percentile      Boys Diastolic BP Percentile      Pulse Rate 97     Resp 19     Temp 98.4 F (36.9 C)     Temp src      SpO2 95 %     Weight 155 lb (70.3 kg)     Height 5' 2 (1.575 m)     Head Circumference      Peak Flow      Pain Score 10     Pain Loc      Pain Education      Exclude from Growth Chart     Most recent vital signs: Vitals:   08/30/24 1402 08/30/24 1510  BP:  (!) 153/100  Pulse:  96  Resp:  18  Temp:  97.7 F (36.5 C)  SpO2: 95% 95%     General: Awake, no distress.  CV:  Good peripheral perfusion.  Resp:  Normal effort.  Abd:  No distention.  Soft nontender Other:  No flank or CVA tenderness bilaterally   ED Results / Procedures / Treatments   Labs (all labs ordered are listed, but only abnormal results are displayed) Labs Reviewed  CBC - Abnormal; Notable for the following components:      Result Value   WBC 11.2 (*)    All other components within normal limits  BASIC METABOLIC PANEL WITH GFR - Abnormal; Notable for the following components:   Potassium 2.6 (*)    Chloride 94 (*)    Glucose, Bld 129 (*)    Anion gap 19 (*)     All other components within normal limits  MAGNESIUM  PHOSPHORUS  PREGNANCY, URINE  URINALYSIS, ROUTINE W REFLEX MICROSCOPIC      RADIOLOGY On my independent interpretation, CT without obvious hydronephrosis   PROCEDURES:  Critical Care performed: No  Procedures   MEDICATIONS ORDERED IN ED: Medications  potassium chloride  10 mEq in 100 mL IVPB (10 mEq Intravenous New Bag/Given 08/30/24 1503)  tamsulosin  (FLOMAX ) capsule 0.4 mg (has no administration in time range)  potassium chloride  SA (KLOR-CON  M) CR tablet 40 mEq (40 mEq Oral Given 08/30/24 1404)  morphine  (PF) 4 MG/ML injection 4 mg (4 mg Intravenous Given 08/30/24 1508)     IMPRESSION / MDM / ASSESSMENT AND PLAN / ED COURSE  I reviewed the triage vital signs and the nursing notes.  Differential diagnosis includes, but is not limited to, musculoskeletal pain, strain, nephrolithiasis, UTI.  Labs, CT.  Patient's presentation is most consistent with acute presentation with potential threat to life or bodily function.  Independent interpretation of labs and imaging below.  CT shows recurrent distal left stone without significant hydroureter or hydronephrosis.  Will give her some Flomax  here.  She is asking for something for pain.  Will give her some IV morphine .  She still pending her UA.  Likely able to be discharged with outpatient urology follow-up.  Patient signed out pending UA.  Likely able to be discharged with outpatient urologic follow-up.  Will send Flomax  to her pharmacy.    Clinical Course as of 08/30/24 1519  Thu Aug 30, 2024  1415 Independent review of labs, mild leukocytosis, she is hypokalemic, will replete, creatinine is normal [TT]  1446 CT ABDOMEN PELVIS WO CONTRAST IMPRESSION: 1. Recurrent or persistent distal left ureteric stone, without significant hydroureter or hydronephrosis. 2. Right nephrolithiasis. 3. Hepatic steatosis and hepatomegaly. 4. Aortic Atherosclerosis  (ICD10-I70.0) and Emphysema (ICD10-J43.9). 5. Significantly age advanced coronary artery atherosclerosis. Recommend assessment of coronary risk factors.   [TT]  1517 Preg Test, Ur: NEGATIVE [TT]  1518 Mag and Phos are not elevated. [TT]    Clinical Course User Index [TT] Waymond Lorelle Cummins, MD     FINAL CLINICAL IMPRESSION(S) / ED DIAGNOSES   Final diagnoses:  Left flank pain  Kidney stone     Rx / DC Orders   ED Discharge Orders          Ordered    tamsulosin  (FLOMAX ) 0.4 MG CAPS capsule  Daily        08/30/24 1518             Note:  This document was prepared using Dragon voice recognition software and may include unintentional dictation errors.    Waymond Lorelle Cummins, MD 08/30/24 [phone]

## 2024-08-30 NOTE — ED Notes (Signed)
 POCT negative

## 2024-08-30 NOTE — ED Triage Notes (Signed)
 Pt to ED for left flank pain started this am. +nausea. Hx kidney stones.

## 2024-08-31 ENCOUNTER — Other Ambulatory Visit: Payer: Self-pay | Admitting: Obstetrics and Gynecology

## 2024-08-31 DIAGNOSIS — R928 Other abnormal and inconclusive findings on diagnostic imaging of breast: Secondary | ICD-10-CM

## 2024-09-01 LAB — URINE CULTURE

## 2024-09-02 ENCOUNTER — Emergency Department
Admission: EM | Admit: 2024-09-02 | Discharge: 2024-09-02 | Disposition: A | Discharge status: Home / Self Care | Attending: Emergency Medicine | Admitting: Emergency Medicine

## 2024-09-02 ENCOUNTER — Other Ambulatory Visit: Payer: Self-pay

## 2024-09-02 DIAGNOSIS — R10A2 Flank pain, left side: Secondary | ICD-10-CM | POA: Diagnosis present

## 2024-09-02 DIAGNOSIS — E876 Hypokalemia: Secondary | ICD-10-CM | POA: Insufficient documentation

## 2024-09-02 DIAGNOSIS — N201 Calculus of ureter: Secondary | ICD-10-CM | POA: Insufficient documentation

## 2024-09-02 DIAGNOSIS — E119 Type 2 diabetes mellitus without complications: Secondary | ICD-10-CM | POA: Diagnosis not present

## 2024-09-02 DIAGNOSIS — E878 Other disorders of electrolyte and fluid balance, not elsewhere classified: Secondary | ICD-10-CM | POA: Insufficient documentation

## 2024-09-02 DIAGNOSIS — I10 Essential (primary) hypertension: Secondary | ICD-10-CM | POA: Insufficient documentation

## 2024-09-02 DIAGNOSIS — D72829 Elevated white blood cell count, unspecified: Secondary | ICD-10-CM | POA: Diagnosis not present

## 2024-09-02 LAB — BASIC METABOLIC PANEL WITH GFR
Anion gap: 16 — ABNORMAL HIGH (ref 5–15)
BUN: <5 mg/dL — ABNORMAL LOW (ref 6–20)
CO2: 24 mmol/L (ref 22–32)
Calcium: 9.5 mg/dL (ref 8.9–10.3)
Chloride: 95 mmol/L — ABNORMAL LOW (ref 98–111)
Creatinine, Ser: 0.79 mg/dL (ref 0.44–1.00)
GFR, Estimated: >60 mL/min
Glucose, Bld: 122 mg/dL — ABNORMAL HIGH (ref 70–99)
Potassium: 2.7 mmol/L — CL (ref 3.5–5.1)
Sodium: 135 mmol/L (ref 135–145)

## 2024-09-02 LAB — URINALYSIS, ROUTINE W REFLEX MICROSCOPIC
Bilirubin Urine: NEGATIVE
Glucose, UA: NEGATIVE mg/dL
Ketones, ur: NEGATIVE mg/dL
Leukocytes,Ua: NEGATIVE
Nitrite: NEGATIVE
Protein, ur: NEGATIVE mg/dL
Specific Gravity, Urine: 1.017 (ref 1.005–1.030)
pH: 5 (ref 5.0–8.0)

## 2024-09-02 LAB — CBC
HCT: 40.2 % (ref 36.0–46.0)
Hemoglobin: 13.9 g/dL (ref 12.0–15.0)
MCH: 30 pg (ref 26.0–34.0)
MCHC: 34.6 g/dL (ref 30.0–36.0)
MCV: 86.6 fL (ref 80.0–100.0)
Platelets: 334 K/uL (ref 150–400)
RBC: 4.64 MIL/uL (ref 3.87–5.11)
RDW: 14.3 % (ref 11.5–15.5)
WBC: 12.3 K/uL — ABNORMAL HIGH (ref 4.0–10.5)
nRBC: 0 % (ref 0.0–0.2)

## 2024-09-02 LAB — POC URINE PREG, ED: Preg Test, Ur: NEGATIVE

## 2024-09-02 LAB — MAGNESIUM: Magnesium: 2.2 mg/dL (ref 1.7–2.4)

## 2024-09-02 MED ORDER — OXYCODONE-ACETAMINOPHEN 5-325 MG PO TABS: 1.0000 | ORAL_TABLET | Freq: Four times a day (QID) | ORAL | 0 refills | Status: AC | PRN

## 2024-09-02 MED ORDER — ONDANSETRON HCL 4 MG/2ML IJ SOLN
4.0000 mg | Freq: Once | INTRAMUSCULAR | Status: AC
  Administered 2024-09-02: 4 mg via INTRAVENOUS
  Filled 2024-09-02: qty 2

## 2024-09-02 MED ORDER — POTASSIUM CHLORIDE CRYS ER 20 MEQ PO TBCR
40.0000 meq | EXTENDED_RELEASE_TABLET | Freq: Once | ORAL | Status: AC
  Administered 2024-09-02: 40 meq via ORAL
  Filled 2024-09-02: qty 2

## 2024-09-02 MED ORDER — SODIUM CHLORIDE 0.9 % IV BOLUS
1000.0000 mL | Freq: Once | INTRAVENOUS | Status: AC
  Administered 2024-09-02: 1000 mL via INTRAVENOUS

## 2024-09-02 MED ORDER — MORPHINE SULFATE (PF) 4 MG/ML IV SOLN
4.0000 mg | Freq: Once | INTRAVENOUS | Status: AC
  Administered 2024-09-02: 4 mg via INTRAVENOUS
  Filled 2024-09-02: qty 1

## 2024-09-02 MED ORDER — KETOROLAC TROMETHAMINE 15 MG/ML IJ SOLN
15.0000 mg | Freq: Once | INTRAMUSCULAR | Status: AC
  Administered 2024-09-02: 15 mg via INTRAVENOUS
  Filled 2024-09-02: qty 1

## 2024-09-02 MED ORDER — ONDANSETRON 4 MG PO TBDP: 4.0000 mg | ORAL_TABLET | Freq: Three times a day (TID) | ORAL | 0 refills | Status: AC | PRN

## 2024-09-02 NOTE — ED Provider Notes (Signed)
 "  [facility] Provider Note    Event Date/Time   First MD Initiated Contact with Patient 09/02/24 1527     (approximate)   History   Flank Pain   HPI  Tracy Mason is a 49 y.o. female with PMH of hypertension, diabetes, ovarian cyst and nephrolithiasis who presents for evaluation of left flank pain and vomiting.  Patient was seen in the ED on 1/15 and had CT scan that showed ureterolithiasis.  Patient was given Flomax  and pain medication.  She was not having any nausea at the time.  Patient reports she began vomiting this morning and has not been able to keep her pain medication down.      Physical Exam   Triage Vital Signs: ED Triage Vitals  Encounter Vitals Group     BP 09/02/24 1525 (!) 184/107     Girls Systolic BP Percentile --      Girls Diastolic BP Percentile --      Boys Systolic BP Percentile --      Boys Diastolic BP Percentile --      Pulse Rate 09/02/24 1525 95     Resp 09/02/24 1525 20     Temp 09/02/24 1525 98.2 F (36.8 C)     Temp Source 09/02/24 1525 Oral     SpO2 09/02/24 1525 98 %     Weight 09/02/24 1524 154 lb 5.2 oz (70 kg)     Height 09/02/24 1524 5' 2 (1.575 m)     Head Circumference --      Peak Flow --      Pain Score 09/02/24 1523 10     Pain Loc --      Pain Education --      Exclude from Growth Chart --     Most recent vital signs: Vitals:   09/02/24 1525  BP: (!) 184/107  Pulse: 95  Resp: 20  Temp: 98.2 F (36.8 C)  SpO2: 98%   General: Awake, no distress.  CV:  Good peripheral perfusion.  RRR Resp:  Normal effort.  CTAB Abd:  No distention.  No tenderness to palpation. Other:     ED Results / Procedures / Treatments   Labs (all labs ordered are listed, but only abnormal results are displayed) Labs Reviewed  URINALYSIS, ROUTINE W REFLEX MICROSCOPIC - Abnormal; Notable for the following components:      Result Value   Color, Urine YELLOW (*)    APPearance CLEAR (*)    Hgb urine dipstick  MODERATE (*)    Bacteria, UA RARE (*)    All other components within normal limits  BASIC METABOLIC PANEL WITH GFR - Abnormal; Notable for the following components:   Potassium 2.7 (*)    Chloride 95 (*)    Glucose, Bld 122 (*)    BUN <5 (*)    Anion gap 16 (*)    All other components within normal limits  CBC - Abnormal; Notable for the following components:   WBC 12.3 (*)    All other components within normal limits  MAGNESIUM  POC URINE PREG, ED    PROCEDURES:  Critical Care performed: No  Procedures   MEDICATIONS ORDERED IN ED: Medications  sodium chloride  0.9 % bolus 1,000 mL (1,000 mLs Intravenous New Bag/Given 09/02/24 1637)  ondansetron  (ZOFRAN ) injection 4 mg (4 mg Intravenous Given 09/02/24 1640)  potassium chloride  SA (KLOR-CON  M) CR tablet 40 mEq (40 mEq Oral Given 09/02/24 1718)  ketorolac  (TORADOL ) 15  MG/ML injection 15 mg (15 mg Intravenous Given 09/02/24 1640)  morphine  (PF) 4 MG/ML injection 4 mg (4 mg Intravenous Given 09/02/24 1718)     IMPRESSION / MDM / ASSESSMENT AND PLAN / ED COURSE  I reviewed the triage vital signs and the nursing notes.                             49 year old female presents for evaluation of left-sided flank pain and vomiting.  Patient's blood pressure was elevated which I suspect is due to her underlying hypertension as well as pain.  Vital signs stable otherwise.  Patient uncomfortable appearing on exam.  Differential diagnosis includes, but is not limited to, electrolyte abnormality, dehydration, pain control.  Patient's presentation is most consistent with acute complicated illness / injury requiring diagnostic workup.  Will obtain labs and UA.  Do not feel that repeat imaging is indicated.  Will plan to address patient's pain, nausea and give her some IV fluids given the persistent vomiting.  If patient able to tolerate p.o. feel she would be stable for outpatient management.   Clinical Course as of 09/02/24 1841  [place] Sep 02, 2024  1741 Basic metabolic panel(!!) Notable for hypokalemia, hypochloremia.  Patient has a history of hypokalemia and reports she takes potassium supplements daily.  Will check a magnesium. [LD]  1743 CBC(!) Mild leukocytosis otherwise unremarkable. [LD]  1743 Magnesium Within normal limits. [LD]  1743 POC urine preg, ED Negative. [LD]  1744 Urinalysis, Routine w reflex microscopic -Urine, Clean Catch(!) No nitrites, leukocytes, small amount of WBCs and bacteria.  Do not feel this is consistent with an infected stone. [LD]  8161 Patient reassessed and her pain has greatly improved.  She is tolerating p.o. and is ready to be discharged.  Will give her follow-up with urology.  Will send pain medication and nausea medication.  Patient did not need a note for work.  She voiced understanding, all questions were answered and she stable at discharge. [LD]    Clinical Course User Index [LD] Cleaster Tinnie LABOR, PA-C     FINAL CLINICAL IMPRESSION(S) / ED DIAGNOSES   Final diagnoses:  Ureterolithiasis     Rx / DC Orders   ED Discharge Orders          Ordered    oxyCODONE -acetaminophen  (PERCOCET) 5-325 MG tablet  Every 6 hours PRN        09/02/24 1840    ondansetron  (ZOFRAN -ODT) 4 MG disintegrating tablet  Every 8 hours PRN        09/02/24 1840             Note:  This document was prepared using Dragon voice recognition software and may include unintentional dictation errors.   Cleaster Tinnie LABOR, PA-C 09/02/24 CORINN    Arlander Charleston, MD 09/02/24 1857  "

## 2024-09-02 NOTE — Discharge Instructions (Signed)
 I have sent a strong pain medication called Percocet to the pharmacy.  This medication can be taken every 4-6 hours as needed for severe or breakthrough pain.  This medication can cause dependency so only take if you are unable to control your pain with other medications.  It will make you sleepy so do not drive or operate heavy machinery after taking it.  Do not drink alcohol while taking this medication.  This medication can also cause constipation so please take an over-the-counter stool softener like MiraLAX or Colace while taking it.  This medication contains both oxycodone  and acetaminophen .  Do not take Tylenol  at the same time.  You can take the Percocet or Tylenol  but not both.  I have sent nausea medication called Zofran . This can be take every 8 hours as needed.  Return to the ED with any worsening symptoms.

## 2024-09-02 NOTE — ED Triage Notes (Signed)
 Pt to ED for L flank pain and vomiting. Seen here on 1/15 and diagnosed with 6mm kidney stone. States is unable to hold down the meds that were prescribed at last visit. Pt appears to be in no acute distress, sitting still on bench. Pt rates pain as 10/10. Skin is dry.

## 2024-09-02 NOTE — ED Notes (Signed)
Critical K 2.7 

## 2024-09-06 ENCOUNTER — Encounter

## 2024-09-06 ENCOUNTER — Other Ambulatory Visit

## 2024-09-07 ENCOUNTER — Ambulatory Visit
Admission: RE | Admit: 2024-09-07 | Discharge: 2024-09-07 | Disposition: A | Source: Ambulatory Visit | Attending: Obstetrics and Gynecology | Admitting: Obstetrics and Gynecology

## 2024-09-07 ENCOUNTER — Other Ambulatory Visit: Payer: Self-pay | Admitting: Urology

## 2024-09-07 DIAGNOSIS — R928 Other abnormal and inconclusive findings on diagnostic imaging of breast: Secondary | ICD-10-CM | POA: Insufficient documentation

## 2024-09-10 ENCOUNTER — Ambulatory Visit
Admission: EM | Admit: 2024-09-10 | Discharge: 2024-09-10 | Disposition: A | Discharge status: Home / Self Care | Attending: Emergency Medicine | Admitting: Emergency Medicine

## 2024-09-10 ENCOUNTER — Encounter: Payer: Self-pay | Admitting: Emergency Medicine

## 2024-09-10 DIAGNOSIS — J019 Acute sinusitis, unspecified: Secondary | ICD-10-CM | POA: Diagnosis not present

## 2024-09-10 DIAGNOSIS — J069 Acute upper respiratory infection, unspecified: Secondary | ICD-10-CM

## 2024-09-10 LAB — POC COVID19/FLU A&B COMBO
Covid Antigen, POC: NEGATIVE
Influenza A Antigen, POC: NEGATIVE
Influenza B Antigen, POC: NEGATIVE

## 2024-09-10 MED ORDER — AMOXICILLIN-POT CLAVULANATE 875-125 MG PO TABS: 1.0000 | ORAL_TABLET | Freq: Two times a day (BID) | ORAL | 0 refills | Status: AC

## 2024-09-10 MED ORDER — FLUTICASONE PROPIONATE 50 MCG/ACT NA SUSP: 2.0000 | Freq: Every day | NASAL | 0 refills | Status: AC

## 2024-09-10 MED ORDER — HYDROCOD POLI-CHLORPHE POLI ER 10-8 MG/5ML PO SUER: 5.0000 mL | Freq: Two times a day (BID) | ORAL | 0 refills | Status: AC | PRN

## 2024-09-10 MED ORDER — PREDNISONE 20 MG PO TABS: 40.0000 mg | ORAL_TABLET | Freq: Every day | ORAL | 0 refills | Status: AC

## 2024-09-10 NOTE — Discharge Instructions (Signed)
 COVID, flu negative.  Start Mucinex-D to keep the mucous thin and to decongest you.   If Mucinex D elevates your blood pressure switch to regular Mucinex.  Stop Claritin.  You may take 600 mg of motrin  with 1000 mg of tylenol  up to 3-4 times a day as needed for pain. This is an effective combination for pain.  Wait-and-see prescription of prednisone  for inflammation and pain and Augmentin  for infection.  Tussionex for cough.  Do NOT take any other narcotic medications like oxycodone  or Percocet if taking Tussionex.  Make sure you clarify with the pharmacist which medications contain opiates.  Use a NeilMed sinus rinse with distilled water as often as you want to to reduce nasal congestion. Follow the directions on the box.   Go to www.goodrx.com to look up your medications. This will give you a list of where you can find your prescriptions at the most affordable prices. Or you can ask the pharmacist what the cash price is. This is frequently cheaper than going through insurance.

## 2024-09-10 NOTE — ED Triage Notes (Signed)
 Pt presents with a dry cough and congestion x 2 days. Pt has taken Claritin and ibuprofen  for her symptoms.

## 2024-09-10 NOTE — ED Provider Notes (Signed)
 " HPI  SUBJECTIVE:  Tracy Mason is a 49 y.o. female who presents with 4 days of dry cough, sinus headache, clear rhinorrhea, postnasal drip.  She is unable to sleep at night because of the cough.  No fevers, body aches, facial swelling, upper dental pain, wheezing or shortness of breath, nausea, vomiting, diarrhea, abdominal pain.  No known COVID or flu exposure.  No antibiotics in the past month.  She took ibuprofen  within the past 6 hours of evaluation with improvement in her symptoms.  She has also tried Claritin and a dose of leftover Hycodan.  The Hycodan helped.  No aggravating factors.  She has a past medical history of diabetes, hypertension, and obstructing nephrolithiasis.  She is scheduled for surgery to remove the stone in 1 week.  States she is currently not taking any opiates.  She has a past medical history of diabetes, hypertension, nephrolithiasis.  Past Medical History:  Diagnosis Date   Diabetes mellitus without complication (HCC)    takes Ozempic   Hypertension    Kidney stones    Lower abdominal adhesions    Nephrolithiasis    Approximately 20-25 episodes   Ovarian cyst    Pelvic pain in female    Sleep apnea     Past Surgical History:  Procedure Laterality Date   APPENDECTOMY  1988   BREAST CYST EXCISION Left 1994   benign   CHOLECYSTECTOMY  2001   DILATION AND EVACUATION N/A 04/21/2017   Procedure: DILATATION AND EVACUATION;  Surgeon: Connell Davies, MD;  Location: [facility] ORS;  Service: Gynecology;  Laterality: N/A;   EXTRACORPOREAL SHOCK WAVE LITHOTRIPSY Left 06/25/2024   Procedure: LITHOTRIPSY, ESWL;  Surgeon: Shane Steffan BROCKS, MD;  Location: [facility];  Service: Urology;  Laterality: Left;  LEFT EXTRACORPOREAL SHOCKWAVE LITHOTRIPSY   TUBAL LIGATION Bilateral 04/21/2017   Procedure: BILATERAL TUBAL LIGATION;  Surgeon: Connell Davies, MD;  Location: [facility] ORS;  Service: Gynecology;  Laterality: Bilateral;   UTERINE FIBROID SURGERY  2014   benign    Family  History  Problem Relation Age of Onset   Diabetes Mother    Heart disease Father    Diabetes Maternal Grandmother    Breast cancer Neg Hx     Social History[1]  Current Medications[2]  Allergies[3]   ROS  As noted in HPI.   Physical Exam  BP 133/81 (BP Location: Right Arm)   Pulse 95   Temp 97.9 F (36.6 C) (Oral)   Resp 16   Wt 70.3 kg   LMP 08/31/2024   SpO2 94%   BMI 28.35 kg/m   Constitutional: Well developed, well nourished, no acute distress Eyes: PERRL, EOMI, conjunctiva normal bilaterally HENT: Normocephalic, atraumatic,mucus membranes moist.  Erythematous, swollen nasal mucosa with near occlusion right nare.  No maxillary, frontal sinus tenderness.  Clear nasal congestion.  Positive postnasal drip. Respiratory: Clear to auscultation bilaterally, no rales, no wheezing, no rhonchi.  No anterior, lateral chest wall tenderness Cardiovascular: Normal rate and rhythm, no murmurs, no gallops, no rubs GI: nondistended skin: No rash, skin intact Musculoskeletal: no deformities Neurologic: Alert & oriented x 3, CN III-XII grossly intact, no motor deficits, sensation grossly intact Psychiatric: Speech and behavior appropriate   ED Course   Medications - No data to display  Orders Placed This Encounter  Procedures   POC Covid19/Flu A&B Antigen    Standing Status:   Standing    Number of Occurrences:   1   Results for orders placed or performed during the  hospital encounter of 09/10/24 (from the past 24 hours)  POC Covid19/Flu A&B Antigen     Status: Normal   Collection Time: 09/10/24  4:36 PM  Result Value Ref Range   Influenza A Antigen, POC Negative Negative   Influenza B Antigen, POC Negative Negative   Covid Antigen, POC Negative Negative   No results found.  ED Clinical Impression  1. Upper respiratory infection with cough and congestion   2. Acute non-recurrent sinusitis, unspecified location      ED Assessment/Plan    COVID, flu negative.   Discussed with patient while in department.  [place] Narcotic database reviewed for this patient, and feel that the risk/benefit ratio today is favorable for proceeding with a prescription for controlled substance.  She has a recent prescription of 5-day course of Percocet, but this is for obstructing nephrolithiasis.  Patient states that she is currently not taking this.  Presentation consistent with a viral URI/sinusitis with cough.  Will send home with Flonase , saline nasal irrigation, Tylenol  combined with ibuprofen  3-4 times a day, discontinue Claritin, start Mucinex D.  She is to monitor her blood pressure.  If this elevates her blood pressure, she will switch to plain Mucinex.  Tussionex for cough.  Wait-and-see prescription of prednisone  40 mg for 5 days and Augmentin  as she has upcoming surgery in a week.  Discussed labs,  MDM, treatment plan, and plan for follow-up with patient  patient agrees with plan.   Meds ordered this encounter  Medications   fluticasone  (FLONASE ) 50 MCG/ACT nasal spray    Sig: Place 2 sprays into both nostrils daily.    Dispense:  16 g    Refill:  0   predniSONE  (DELTASONE ) 20 MG tablet    Sig: Take 2 tablets (40 mg total) by mouth daily with breakfast for 5 days.    Dispense:  10 tablet    Refill:  0   chlorpheniramine-HYDROcodone  (TUSSIONEX) 10-8 MG/5ML    Sig: Take 5 mLs by mouth every 12 (twelve) hours as needed for cough.    Dispense:  60 mL    Refill:  0   amoxicillin -clavulanate (AUGMENTIN ) 875-125 MG tablet    Sig: Take 1 tablet by mouth every 12 (twelve) hours.    Dispense:  14 tablet    Refill:  0      *This clinic note was created using Scientist, clinical (histocompatibility and immunogenetics). Therefore, there may be occasional mistakes despite careful proofreading. ?      [1]  Social History Tobacco Use   Smoking status: Former    Current packs/day: 0.00    Types: Cigarettes    Start date: 05/16/1994    Quit date: 05/16/2004    Years since quitting: 20.3    Smokeless tobacco: Never  Vaping Use   Vaping status: Never Used  Substance Use Topics   Alcohol use: No   Drug use: No  [2] No current facility-administered medications for this encounter.  Current Outpatient Medications:    amoxicillin -clavulanate (AUGMENTIN ) 875-125 MG tablet, Take 1 tablet by mouth every 12 (twelve) hours., Disp: 14 tablet, Rfl: 0   chlorpheniramine-HYDROcodone  (TUSSIONEX) 10-8 MG/5ML, Take 5 mLs by mouth every 12 (twelve) hours as needed for cough., Disp: 60 mL, Rfl: 0   fluticasone  (FLONASE ) 50 MCG/ACT nasal spray, Place 2 sprays into both nostrils daily., Disp: 16 g, Rfl: 0   predniSONE  (DELTASONE ) 20 MG tablet, Take 2 tablets (40 mg total) by mouth daily with breakfast for 5 days., Disp: 10 tablet, Rfl: 0  losartan (COZAAR) 25 MG tablet, Take 25 mg by mouth daily., Disp: , Rfl:    ondansetron  (ZOFRAN -ODT) 4 MG disintegrating tablet, Take 1 tablet (4 mg total) by mouth every 8 (eight) hours as needed for nausea or vomiting., Disp: 20 tablet, Rfl: 0   [Paused] oxyCODONE  (ROXICODONE ) 5 MG immediate release tablet, Take 1 tablet (5 mg total) by mouth every 8 (eight) hours as needed for up to 10 doses for severe pain (pain score 7-10). (Patient not taking: Reported on 09/10/2024), Disp: 10 tablet, Rfl: 0   [Paused] oxyCODONE -acetaminophen  (PERCOCET/ROXICET) 5-325 MG tablet, Take 1 tablet by mouth every 4 (four) hours as needed., Disp: , Rfl:    Semaglutide, 2 MG/DOSE, 8 MG/3ML SOPN, Inject 2 mg into the skin once a week., Disp: , Rfl:    tamsulosin  (FLOMAX ) 0.4 MG CAPS capsule, Take 1 capsule (0.4 mg total) by mouth daily. (Patient taking differently: Take 0.4 mg by mouth daily as needed (kidney stones).), Disp: 30 capsule, Rfl: 0 [3]  Allergies Allergen Reactions   Biaxin [Clarithromycin] Hives   Meclizine Hives     Levaughn Puccinelli, MD 09/11/24 1005  "

## 2024-09-11 ENCOUNTER — Ambulatory Visit: Payer: Self-pay

## 2024-09-11 ENCOUNTER — Other Ambulatory Visit: Payer: Self-pay | Admitting: Obstetrics and Gynecology

## 2024-09-11 DIAGNOSIS — R921 Mammographic calcification found on diagnostic imaging of breast: Secondary | ICD-10-CM

## 2024-09-11 DIAGNOSIS — R928 Other abnormal and inconclusive findings on diagnostic imaging of breast: Secondary | ICD-10-CM

## 2024-09-11 DIAGNOSIS — N6489 Other specified disorders of breast: Secondary | ICD-10-CM

## 2024-09-12 ENCOUNTER — Inpatient Hospital Stay
Admission: RE | Admit: 2024-09-12 | Discharge: 2024-09-12 | Attending: Obstetrics and Gynecology | Admitting: Obstetrics and Gynecology

## 2024-09-12 ENCOUNTER — Ambulatory Visit
Admission: RE | Admit: 2024-09-12 | Discharge: 2024-09-12 | Disposition: A | Discharge status: Home / Self Care | Source: Ambulatory Visit | Attending: Obstetrics and Gynecology | Admitting: Obstetrics and Gynecology

## 2024-09-12 DIAGNOSIS — R928 Other abnormal and inconclusive findings on diagnostic imaging of breast: Secondary | ICD-10-CM

## 2024-09-12 DIAGNOSIS — N6489 Other specified disorders of breast: Secondary | ICD-10-CM

## 2024-09-12 DIAGNOSIS — R921 Mammographic calcification found on diagnostic imaging of breast: Secondary | ICD-10-CM | POA: Diagnosis present

## 2024-09-12 DIAGNOSIS — N62 Hypertrophy of breast: Secondary | ICD-10-CM | POA: Diagnosis present

## 2024-09-12 MED ORDER — LIDOCAINE-EPINEPHRINE 1 %-1:100000 IJ SOLN
20.0000 mL | Freq: Once | INTRAMUSCULAR | Status: AC
  Administered 2024-09-12: 20 mL

## 2024-09-12 MED ORDER — LIDOCAINE 1 % OPTIME INJ - NO CHARGE
5.0000 mL | Freq: Once | INTRAMUSCULAR | Status: AC
  Administered 2024-09-12: 5 mL
  Filled 2024-09-12: qty 6

## 2024-09-12 MED ORDER — LIDOCAINE HCL (PF) 1 % IJ SOLN
5.0000 mL | Freq: Once | INTRAMUSCULAR | Status: AC
  Administered 2024-09-12: 5 mL

## 2024-09-12 MED ORDER — LIDOCAINE-EPINEPHRINE 1 %-1:100000 IJ SOLN
10.0000 mL | Freq: Once | INTRAMUSCULAR | Status: AC
  Administered 2024-09-12: 10 mL

## 2024-09-12 NOTE — Patient Instructions (Signed)
 SURGICAL WAITING ROOM VISITATION  Patients having surgery or a procedure may have no more than 2 support people in the waiting area - these visitors may rotate.    Children ages 78 and under will not be able to visit patients in [facility] under most circumstances.   Visitors with respiratory illnesses are discouraged from visiting and should remain at home.  If the patient needs to stay at the hospital during part of their recovery, the visitor guidelines for inpatient rooms apply. Pre-op nurse will coordinate an appropriate time for 1 support person to accompany patient in pre-op.  This support person may not rotate.    Please refer to the [facility] website for the visitor guidelines for Inpatients (after your surgery is over and you are in a regular room).    Your procedure is scheduled on: 09/18/24   Report to [facility] Main Entrance    Report to admitting at 5:15 AM   Call this number if you have problems the morning of surgery [phone]   Do not eat food or drink liquids :After Midnight.          If you have questions, please contact your surgeons office.   FOLLOW BOWEL PREP AND ANY ADDITIONAL PRE OP INSTRUCTIONS YOU RECEIVED FROM YOUR SURGEON'S OFFICE!!!     Oral Hygiene is also important to reduce your risk of infection.                                    Remember - BRUSH YOUR TEETH THE MORNING OF SURGERY WITH YOUR REGULAR TOOTHPASTE  DENTURES WILL BE REMOVED PRIOR TO SURGERY PLEASE DO NOT APPLY Poly grip OR ADHESIVES!!!   Stop all vitamins and herbal supplements 7 days before surgery.   Take these medicines the morning of surgery with A SIP OF WATER:  Ondansetron  (Zofran ) Prednisone  (Deltasone ) Tamsulosin  (Flomax )   DO NOT TAKE ANY ORAL DIABETIC MEDICATIONS DAY OF YOUR SURGERY   How to Manage Your Diabetes Before and After Surgery  Why is it important to control my blood sugar before and after surgery? Improving blood sugar levels  before and after surgery helps healing and can limit problems. A way of improving blood sugar control is eating a healthy diet by:  Eating less sugar and carbohydrates  Increasing activity/exercise  Talking with your doctor about reaching your blood sugar goals High blood sugars (greater than 180 mg/dL) can raise your risk of infections and slow your recovery, so you will need to focus on controlling your diabetes during the weeks before surgery. Make sure that the doctor who takes care of your diabetes knows about your planned surgery including the date and location.  How do I manage my blood sugar before surgery? Check your blood sugar at least 4 times a day, starting 2 days before surgery, to make sure that the level is not too high or low. Check your blood sugar the morning of your surgery when you wake up and every 2 hours until you get to the Short Stay unit. If your blood sugar is less than 70 mg/dL, you will need to treat for low blood sugar: Do not take insulin. Treat a low blood sugar (less than 70 mg/dL) with  cup of clear juice (cranberry or apple), 4 glucose tablets, OR glucose gel. Recheck blood sugar in 15 minutes after treatment (to make sure it is greater than 70 mg/dL).  If your blood sugar is not greater than 70 mg/dL on recheck, call [phone] for further instructions. Report your blood sugar to the short stay nurse when you get to Short Stay.  If you are admitted to the hospital after surgery: Your blood sugar will be checked by the staff and you will probably be given insulin after surgery (instead of oral diabetes medicines) to make sure you have good blood sugar levels. The goal for blood sugar control after surgery is 80-180 mg/dL.   WHAT DO I DO ABOUT MY DIABETES MEDICATION?  Do not take oral diabetes medicines (pills) the morning of surgery.  Do not take Ozempic after 09/10/24  DO NOT TAKE THE FOLLOWING 7 DAYS PRIOR TO SURGERY: Ozempic, Wegovy, Rybelsus  (Semaglutide), Byetta (exenatide), Bydureon (exenatide ER), Victoza, Saxenda (liraglutide), or Trulicity (dulaglutide) Mounjaro (Tirzepatide) Adlyxin (Lixisenatide), Polyethylene Glycol Loxenatide.  Reviewed and Endorsed by [facility] Patient Education Committee, August 2015                              You may not have any metal on your body including hair pins, jewelry, and body piercing             Do not wear make-up, lotions, powders, perfumes, or deodorant  Do not wear nail polish including gel and S&S, artificial/acrylic nails, or any other type of covering on natural nails including finger and toenails. If you have artificial nails, gel coating, etc. that needs to be removed by a nail salon please have this removed prior to surgery or surgery may need to be canceled/ delayed if the surgeon/ anesthesia feels like they are unable to be safely monitored.   Do not shave  48 hours prior to surgery.    Do not bring valuables to the hospital. [facility] IS NOT             RESPONSIBLE   FOR VALUABLES.   Contacts, glasses, dentures or bridgework may not be worn into surgery.  DO NOT BRING YOUR HOME MEDICATIONS TO THE HOSPITAL. PHARMACY WILL DISPENSE MEDICATIONS LISTED ON YOUR MEDICATION LIST TO YOU DURING YOUR ADMISSION IN THE HOSPITAL!    Patients discharged on the day of surgery will not be allowed to drive home.  Someone NEEDS to stay with you for the first 24 hours after anesthesia.              Please read over the following fact sheets you were given: IF YOU HAVE QUESTIONS ABOUT YOUR PRE-OP INSTRUCTIONS PLEASE CALL [phone] [place].   If you received a COVID test during your pre-op visit  it is requested that you wear a mask when out in public, stay away from anyone that may not be feeling well and notify your surgeon if you develop symptoms. If you test positive for Covid or have been in contact with anyone that has tested positive in the last 10 days please notify you  surgeon.    [facility] - Preparing for Surgery Before surgery, you can play an important role.  Because skin is not sterile, your skin needs to be as free of germs as possible.  You can reduce the number of germs on your skin by washing with CHG (chlorahexidine gluconate) soap before surgery.  CHG is an antiseptic cleaner which kills germs and bonds with the skin to continue killing germs even after washing. Please DO NOT use if you have an allergy to CHG  or antibacterial soaps.  If your skin becomes reddened/irritated stop using the CHG and inform your nurse when you arrive at Short Stay. Do not shave (including legs and underarms) for at least 48 hours prior to the first CHG shower.  You may shave your face/neck.  Please follow these instructions carefully:  1.  Shower with CHG Soap the night before surgery and the morning of surgery.  2.  If you choose to wash your hair, wash your hair first as usual with your normal  shampoo.  3.  After you shampoo, rinse your hair and body thoroughly to remove the shampoo.                             4.  Use CHG as you would any other liquid soap.  You can apply chg directly to the skin and wash.  Gently with a scrungie or clean washcloth.  5.  Apply the CHG Soap to your body ONLY FROM THE NECK DOWN.   Do   not use on face/ open                           Wound or open sores. Avoid contact with eyes, ears mouth and   genitals (private parts).                       Wash face,  Genitals (private parts) with your normal soap.             6.  Wash thoroughly, paying special attention to the area where your    surgery  will be performed.  7.  Thoroughly rinse your body with warm water from the neck down.  8.  DO NOT shower/wash with your normal soap after using and rinsing off the CHG Soap.                9.  Pat yourself dry with a clean towel.            10.  Wear clean pajamas.            11.  Place clean sheets on your bed the night of your first shower and  do not  sleep with pets. Day of Surgery : Do not apply any lotions/deodorants the morning of surgery.  Please wear clean clothes to the hospital/surgery center.  FAILURE TO FOLLOW THESE INSTRUCTIONS MAY RESULT IN THE CANCELLATION OF YOUR SURGERY  PATIENT SIGNATURE_________________________________  NURSE SIGNATURE__________________________________  ________________________________________________________________________

## 2024-09-12 NOTE — Progress Notes (Signed)
 Date of COVID positive in last 90 days:  PCP - Alm Needle, MD Cardiologist - n/a  Chest x-ray - N/A EKG - 06/14/24 Epic  Stress Test - N/A ECHO - N/A Cardiac Cath - N/A Pacemaker/ICD device last checked:N/A Spinal Cord Stimulator:N/A  Bowel Prep - N/A  Sleep Study - yes CPAP - no CPAP  Fasting Blood Sugar - Checks Blood Sugar no checks at home  Last dose of GLP1 agonist-  Ozempic, has been hold 2 weeks GLP1 instructions:  Do not take after  09/10/24   Last dose of SGLT-2 inhibitors-  N/A SGLT-2 instructions:  Do not take after     Blood Thinner Instructions: N/A Last dose:   Time: Aspirin Instructions:N/A Last Dose:  Activity level: Can go up a flight of stairs and perform activities of daily living without stopping and without symptoms of chest pain or shortness of breath.   Anesthesia review: went to urgent care 09/10/24 for cold symptoms. Patient is feeling improved today. She still reports a mild cough right when she wakes up and before going to bed. K+ 2.6  Patient denies shortness of breath, fever, cough and chest pain at PAT appointment  Patient verbalized understanding of instructions that were given to them at the PAT appointment. Patient was also instructed that they will need to review over the PAT instructions again at home before surgery.

## 2024-09-13 ENCOUNTER — Other Ambulatory Visit: Payer: Self-pay

## 2024-09-13 ENCOUNTER — Encounter (HOSPITAL_COMMUNITY): Payer: Self-pay

## 2024-09-13 ENCOUNTER — Encounter (HOSPITAL_COMMUNITY)
Admission: RE | Admit: 2024-09-13 | Discharge: 2024-09-13 | Disposition: A | Discharge status: Home / Self Care | Source: Ambulatory Visit | Attending: Urology | Admitting: Urology

## 2024-09-13 VITALS — BP 140/82 | HR 96 | Temp 98.1°F | Ht 62.0 in | Wt 160.0 lb

## 2024-09-13 DIAGNOSIS — Z87442 Personal history of urinary calculi: Secondary | ICD-10-CM | POA: Diagnosis not present

## 2024-09-13 DIAGNOSIS — N201 Calculus of ureter: Secondary | ICD-10-CM | POA: Diagnosis not present

## 2024-09-13 DIAGNOSIS — E119 Type 2 diabetes mellitus without complications: Secondary | ICD-10-CM | POA: Insufficient documentation

## 2024-09-13 DIAGNOSIS — Z01818 Encounter for other preprocedural examination: Secondary | ICD-10-CM | POA: Diagnosis present

## 2024-09-13 DIAGNOSIS — I10 Essential (primary) hypertension: Secondary | ICD-10-CM | POA: Insufficient documentation

## 2024-09-13 DIAGNOSIS — G4733 Obstructive sleep apnea (adult) (pediatric): Secondary | ICD-10-CM | POA: Diagnosis not present

## 2024-09-13 DIAGNOSIS — Z87891 Personal history of nicotine dependence: Secondary | ICD-10-CM | POA: Insufficient documentation

## 2024-09-13 DIAGNOSIS — I2510 Atherosclerotic heart disease of native coronary artery without angina pectoris: Secondary | ICD-10-CM | POA: Insufficient documentation

## 2024-09-13 DIAGNOSIS — Z01812 Encounter for preprocedural laboratory examination: Secondary | ICD-10-CM | POA: Insufficient documentation

## 2024-09-13 HISTORY — DX: Malignant (primary) neoplasm, unspecified: C80.1

## 2024-09-13 HISTORY — DX: Headache, unspecified: R51.9

## 2024-09-13 LAB — BASIC METABOLIC PANEL WITH GFR
Anion gap: 18 — ABNORMAL HIGH (ref 5–15)
BUN: 6 mg/dL (ref 6–20)
CO2: 25 mmol/L (ref 22–32)
Calcium: 9.7 mg/dL (ref 8.9–10.3)
Chloride: 95 mmol/L — ABNORMAL LOW (ref 98–111)
Creatinine, Ser: 0.7 mg/dL (ref 0.44–1.00)
GFR, Estimated: >60 mL/min
Glucose, Bld: 181 mg/dL — ABNORMAL HIGH (ref 70–99)
Potassium: 2.6 mmol/L — CL (ref 3.5–5.1)
Sodium: 137 mmol/L (ref 135–145)

## 2024-09-13 LAB — SURGICAL PATHOLOGY

## 2024-09-13 LAB — HEMOGLOBIN A1C
Hgb A1c MFr Bld: 5.6 % (ref 4.8–5.6)
Mean Plasma Glucose: 114.02 mg/dL

## 2024-09-13 LAB — GLUCOSE, CAPILLARY: Glucose-Capillary: 207 mg/dL — ABNORMAL HIGH (ref 70–99)

## 2024-09-13 NOTE — Anesthesia Preprocedure Evaluation (Addendum)
"                                    Anesthesia Evaluation  Patient identified by MRN, date of birth, ID band Patient awake    Reviewed: Allergy & Precautions, NPO status , Patient's Chart, lab work & pertinent test results  History of Anesthesia Complications Negative for: history of anesthetic complications  Airway Mallampati: III  TM Distance: >3 FB Neck ROM: Full    Dental  (+) Missing,    Pulmonary sleep apnea , former smoker   Pulmonary exam normal        Cardiovascular hypertension, Pt. on medications Normal cardiovascular exam     Neuro/Psych negative neurological ROS     GI/Hepatic negative GI ROS, Neg liver ROS,,,  Endo/Other  diabetes (on semaglutide), Type 2    Renal/GU negative Renal ROS     Musculoskeletal negative musculoskeletal ROS (+)    Abdominal   Peds  Hematology negative hematology ROS (+)   Anesthesia Other Findings   Reproductive/Obstetrics                              Anesthesia Physical Anesthesia Plan  ASA: 2  Anesthesia Plan: General   Post-op Pain Management: Tylenol  PO (pre-op)*   Induction: Intravenous  PONV Risk Score and Plan: 3 and Treatment may vary due to age or medical condition, Ondansetron , Dexamethasone  and Midazolam   Airway Management Planned: LMA  Additional Equipment: None  Intra-op Plan:   Post-operative Plan: Extubation in OR  Informed Consent: I have reviewed the patients History and Physical, chart, labs and discussed the procedure including the risks, benefits and alternatives for the proposed anesthesia with the patient or authorized representative who has indicated his/her understanding and acceptance.     Dental advisory given  Plan Discussed with: CRNA  Anesthesia Plan Comments: (See PAT note from 1/29)         Anesthesia Quick Evaluation  "

## 2024-09-13 NOTE — Progress Notes (Signed)
 " Case: [id number] Date/Time: 09/18/24 0715   Procedure: CYSTOSCOPY/URETEROSCOPY/HOLMIUM LASER/STENT PLACEMENT (Bilateral) - CYSTOSCOPY/BILATERAL URETEROSCOPY/HOLMIUM LASER/STENT PLACEMENT   Anesthesia type: General   Diagnosis:      Ureteral stone [N20.1]     Kidney stone [N20.0]   Pre-op diagnosis: LEFT URETERAL STONE AND RIGHT RENAL STONE   Location: [facility]OM 02 / [facility]   Surgeons: Devere Tracy Righter, MD       DISCUSSION: Tracy Mason is a 49 yo female with PMH of former smoking, HTN, CAD (on imaging), headaches, OSA (no CPAP), DM, kidney stones  Seen in the ED on 06/14/24 for flank pain. Diagnosed with a left obstructing kidney stone with moderate hydronephrosis. K was noted to be 2.6 and she was given a prescription for 10mEq K to take TID. She was scheduled to undergo lithotripsy in the mobile unit on 06/25/24 with Dr Tracy Mason however stone was not visualized and procedure was ended.  Seen in the ED on 08/30/24 for flank pain. Diagnosed with another left sided kidney stone (either recurrent or persistent) without hydronephrosis. Now scheduled for surgery above.  Seen in the ED on 09/02/24 for recurrent pain with N/V. K noted to be 2.7 and was give PO K. Symptoms were controlled and she was discharged home.  Seen in the ED on 1/26 for URI symptoms. COVID and Flu testing was negative. She was treated with Augmentin , prednisone , flonase , tussionex.   Seen in PAT on 1/29. Patient reports symptoms are resolving. She has a mild residual cough. Due to urgency of surgery anticipate she can proceed. Of note pre op labs were remarkable for K of 2.6. Patient states that she is overall feeling well. She is eating and drinking and without N/V/D. She states she has a prescription for K but is only taking 10mEq once daily. She was advised to increase the potassium from 10 to 40mEq daily and we will recheck DOS.  LD Ozempic: 1/26  VS: BP (!) 140/82   Pulse 96   Temp 36.7 C   Ht 5' 2 (1.575  m)   Wt 72.6 kg   LMP 09/13/2024   SpO2 99%   BMI 29.26 kg/m   PROVIDERS: Tracy Alm SQUIBB, MD   LABS: Labs reviewed: Acceptable for surgery. Recheck I stat chem 8 DOS (all labs ordered are listed, but only abnormal results are displayed)  Labs Reviewed  BASIC METABOLIC PANEL WITH GFR - Abnormal; Notable for the following components:      Result Value   Potassium 2.6 (*)    Chloride 95 (*)    Glucose, Bld 181 (*)    Anion gap 18 (*)    All other components within normal limits  GLUCOSE, CAPILLARY - Abnormal; Notable for the following components:   Glucose-Capillary 207 (*)    All other components within normal limits  HEMOGLOBIN A1C     CT Abdomen/Pelvis 08/30/24:   IMPRESSION: 1. Recurrent or persistent distal left ureteric stone, without significant hydroureter or hydronephrosis. 2. Right nephrolithiasis. 3. Hepatic steatosis and hepatomegaly. 4. Aortic Atherosclerosis (ICD10-I70.0) and Emphysema (ICD10-J43.9). 5. Significantly age advanced coronary artery atherosclerosis. Recommend assessment of coronary risk factors.  EKG 06/14/24:  SR Low voltage  Past Medical History:  Diagnosis Date   Cancer (HCC)    basal skin   Diabetes mellitus without complication (HCC)    takes Ozempic   Headache    Hypertension    Kidney stones    Lower abdominal adhesions    Nephrolithiasis    Approximately  20-25 episodes   Ovarian cyst    Pelvic pain in female    Sleep apnea     Past Surgical History:  Procedure Laterality Date   APPENDECTOMY  1988   BREAST BIOPSY Left 09/12/2024   left br stereo calcs #1 x clip   BREAST BIOPSY Left 09/12/2024   #2 asy lateral coil clip   BREAST BIOPSY Left 09/12/2024   #3 medial asy UIQ ribbon clip   BREAST BIOPSY Left 09/12/2024   MM LT BREAST BX W LOC DEV 1ST LESION IMAGE BX SPEC STEREO GUIDE 09/12/2024 [facility]   BREAST BIOPSY Left 09/12/2024   MM LT BREAST BX W LOC DEV EA AD LESION IMG BX SPEC STEREO GUIDE 09/12/2024  [facility]   BREAST BIOPSY Left 09/12/2024   MM LT BREAST BX W LOC DEV EA AD LESION IMG BX SPEC STEREO GUIDE 09/12/2024 [facility]   BREAST CYST EXCISION Left 1994   benign   CHOLECYSTECTOMY  2001   DILATION AND EVACUATION N/A 04/21/2017   Procedure: DILATATION AND EVACUATION;  Surgeon: Tracy Davies, MD;  Location: [facility];  Service: Gynecology;  Laterality: N/A;   EXTRACORPOREAL SHOCK WAVE LITHOTRIPSY Left 06/25/2024   Procedure: LITHOTRIPSY, ESWL;  Surgeon: Tracy Mason Tracy BROCKS, MD;  Location: [facility];  Service: Urology;  Laterality: Left;  LEFT EXTRACORPOREAL SHOCKWAVE LITHOTRIPSY   TUBAL LIGATION Bilateral 04/21/2017   Procedure: BILATERAL TUBAL LIGATION;  Surgeon: Tracy Davies, MD;  Location: [facility];  Service: Gynecology;  Laterality: Bilateral;   UTERINE FIBROID SURGERY  2014   benign    MEDICATIONS:  POTASSIUM CITRATE  ER PO   amoxicillin -clavulanate (AUGMENTIN ) 875-125 MG tablet   chlorpheniramine-HYDROcodone  (TUSSIONEX) 10-8 MG/5ML   fluticasone  (FLONASE ) 50 MCG/ACT nasal spray   losartan (COZAAR) 25 MG tablet   ondansetron  (ZOFRAN -ODT) 4 MG disintegrating tablet   [Paused] oxyCODONE  (ROXICODONE ) 5 MG immediate release tablet   [Paused] oxyCODONE -acetaminophen  (PERCOCET/ROXICET) 5-325 MG tablet   predniSONE  (DELTASONE ) 20 MG tablet   Semaglutide, 2 MG/DOSE, 8 MG/3ML SOPN   tamsulosin  (FLOMAX ) 0.4 MG CAPS capsule   No current facility-administered medications for this encounter.   Burnard Tracy Mason/[facility]/Anesthesiology [facility] Phone [phone] 09/13/2024 2:36 PM        "

## 2024-09-18 ENCOUNTER — Ambulatory Visit (HOSPITAL_COMMUNITY)
Admission: RE | Admit: 2024-09-18 | Discharge: 2024-09-18 | Disposition: A | Discharge status: Home / Self Care | Attending: Urology | Admitting: Urology

## 2024-09-18 ENCOUNTER — Encounter (HOSPITAL_COMMUNITY)
Admission: RE | Disposition: A | Discharge status: Home / Self Care | Payer: Self-pay | Source: Home / Self Care | Attending: Urology

## 2024-09-18 ENCOUNTER — Ambulatory Visit (HOSPITAL_COMMUNITY): Payer: Self-pay | Admitting: Medical

## 2024-09-18 ENCOUNTER — Ambulatory Visit (HOSPITAL_COMMUNITY)

## 2024-09-18 ENCOUNTER — Encounter (HOSPITAL_COMMUNITY): Payer: Self-pay | Admitting: Urology

## 2024-09-18 DIAGNOSIS — I10 Essential (primary) hypertension: Secondary | ICD-10-CM | POA: Insufficient documentation

## 2024-09-18 DIAGNOSIS — E119 Type 2 diabetes mellitus without complications: Secondary | ICD-10-CM | POA: Insufficient documentation

## 2024-09-18 DIAGNOSIS — Z79899 Other long term (current) drug therapy: Secondary | ICD-10-CM | POA: Insufficient documentation

## 2024-09-18 DIAGNOSIS — G4730 Sleep apnea, unspecified: Secondary | ICD-10-CM | POA: Insufficient documentation

## 2024-09-18 DIAGNOSIS — N200 Calculus of kidney: Secondary | ICD-10-CM

## 2024-09-18 DIAGNOSIS — Z87891 Personal history of nicotine dependence: Secondary | ICD-10-CM | POA: Insufficient documentation

## 2024-09-18 DIAGNOSIS — E876 Hypokalemia: Secondary | ICD-10-CM

## 2024-09-18 DIAGNOSIS — N202 Calculus of kidney with calculus of ureter: Secondary | ICD-10-CM | POA: Insufficient documentation

## 2024-09-18 LAB — GLUCOSE, CAPILLARY
Glucose-Capillary: 134 mg/dL — ABNORMAL HIGH (ref 70–99)
Glucose-Capillary: 159 mg/dL — ABNORMAL HIGH (ref 70–99)

## 2024-09-18 LAB — POCT I-STAT, CHEM 8
BUN: 4 mg/dL — ABNORMAL LOW (ref 6–20)
Calcium, Ion: 1.06 mmol/L — ABNORMAL LOW (ref 1.15–1.40)
Chloride: 94 mmol/L — ABNORMAL LOW (ref 98–111)
Creatinine, Ser: 0.7 mg/dL (ref 0.44–1.00)
Glucose, Bld: 122 mg/dL — ABNORMAL HIGH (ref 70–99)
HCT: 38 % (ref 36.0–46.0)
Hemoglobin: 12.9 g/dL (ref 12.0–15.0)
Potassium: 2.9 mmol/L — ABNORMAL LOW (ref 3.5–5.1)
Sodium: 136 mmol/L (ref 135–145)
TCO2: 27 mmol/L (ref 22–32)

## 2024-09-18 LAB — POCT PREGNANCY, URINE: Preg Test, Ur: NEGATIVE

## 2024-09-18 MED ORDER — INSULIN ASPART 100 UNIT/ML IJ SOLN: 0.0000 [IU] | INTRAMUSCULAR | Status: DC | PRN

## 2024-09-18 MED ORDER — OXYCODONE HCL 5 MG/5ML PO SOLN: 5.0000 mg | Freq: Once | ORAL | Status: DC | PRN

## 2024-09-18 MED ORDER — FENTANYL CITRATE (PF) 50 MCG/ML IJ SOSY: 25.0000 ug | PREFILLED_SYRINGE | INTRAMUSCULAR | Status: DC | PRN

## 2024-09-18 MED ORDER — DROPERIDOL 2.5 MG/ML IJ SOLN: 0.6250 mg | Freq: Once | INTRAMUSCULAR | Status: DC | PRN

## 2024-09-18 MED ORDER — IOHEXOL 300 MG/ML  SOLN
INTRAMUSCULAR | Status: DC | PRN
  Administered 2024-09-18: 18 mL

## 2024-09-18 MED ORDER — FENTANYL CITRATE (PF) 100 MCG/2ML IJ SOLN
INTRAMUSCULAR | Status: DC | PRN
  Administered 2024-09-18 (×2): 50 ug via INTRAVENOUS

## 2024-09-18 MED ORDER — SODIUM CHLORIDE 0.9 % IR SOLN
Status: DC | PRN
  Administered 2024-09-18: 3000 mL

## 2024-09-18 MED ORDER — MIDAZOLAM HCL 5 MG/5ML IJ SOLN
INTRAMUSCULAR | Status: DC | PRN
  Administered 2024-09-18: 2 mg via INTRAVENOUS

## 2024-09-18 MED ORDER — PROPOFOL 10 MG/ML IV BOLUS
INTRAVENOUS | Status: AC
  Filled 2024-09-18: qty 20

## 2024-09-18 MED ORDER — ACETAMINOPHEN 500 MG PO TABS
1000.0000 mg | ORAL_TABLET | Freq: Once | ORAL | Status: AC
  Administered 2024-09-18: 1000 mg via ORAL
  Filled 2024-09-18: qty 2

## 2024-09-18 MED ORDER — OXYCODONE HCL 5 MG PO TABS: 5.0000 mg | ORAL_TABLET | Freq: Once | ORAL | Status: DC | PRN

## 2024-09-18 MED ORDER — EPHEDRINE SULFATE (PRESSORS) 25 MG/5ML IV SOSY
PREFILLED_SYRINGE | INTRAVENOUS | Status: DC | PRN
  Administered 2024-09-18: 10 mg via INTRAVENOUS
  Administered 2024-09-18: 5 mg via INTRAVENOUS

## 2024-09-18 MED ORDER — DEXAMETHASONE SODIUM PHOSPHATE 4 MG/ML IJ SOLN
INTRAMUSCULAR | Status: DC | PRN
  Administered 2024-09-18: 5 mg via INTRAVENOUS

## 2024-09-18 MED ORDER — PHENAZOPYRIDINE HCL 200 MG PO TABS: 200.0000 mg | ORAL_TABLET | Freq: Three times a day (TID) | ORAL | 0 refills | Status: AC | PRN

## 2024-09-18 MED ORDER — CHLORHEXIDINE GLUCONATE 0.12 % MT SOLN
15.0000 mL | Freq: Once | OROMUCOSAL | Status: AC
  Administered 2024-09-18: 15 mL via OROMUCOSAL

## 2024-09-18 MED ORDER — FENTANYL CITRATE (PF) 100 MCG/2ML IJ SOLN
INTRAMUSCULAR | Status: AC
  Filled 2024-09-18: qty 2

## 2024-09-18 MED ORDER — OXYBUTYNIN CHLORIDE 5 MG PO TABS: 5.0000 mg | ORAL_TABLET | Freq: Three times a day (TID) | ORAL | 1 refills | Status: AC | PRN

## 2024-09-18 MED ORDER — LACTATED RINGERS IV SOLN: INTRAVENOUS | Status: DC | PRN

## 2024-09-18 MED ORDER — ONDANSETRON HCL 4 MG/2ML IJ SOLN
INTRAMUSCULAR | Status: DC | PRN
  Administered 2024-09-18: 4 mg via INTRAVENOUS

## 2024-09-18 MED ORDER — CEFAZOLIN SODIUM-DEXTROSE 2-4 GM/100ML-% IV SOLN
2.0000 g | INTRAVENOUS | Status: AC
  Administered 2024-09-18: 2 g via INTRAVENOUS
  Filled 2024-09-18: qty 100

## 2024-09-18 MED ORDER — LIDOCAINE HCL (CARDIAC) PF 100 MG/5ML IV SOSY
PREFILLED_SYRINGE | INTRAVENOUS | Status: DC | PRN
  Administered 2024-09-18: 100 mg via INTRAVENOUS

## 2024-09-18 MED ORDER — MIDAZOLAM HCL 2 MG/2ML IJ SOLN
INTRAMUSCULAR | Status: AC
  Filled 2024-09-18: qty 2

## 2024-09-18 MED ORDER — LACTATED RINGERS IV SOLN: INTRAVENOUS | Status: DC

## 2024-09-18 MED ORDER — PHENYLEPHRINE HCL (PRESSORS) 10 MG/ML IV SOLN
INTRAVENOUS | Status: DC | PRN
  Administered 2024-09-18 (×2): 80 ug via INTRAVENOUS

## 2024-09-18 MED ORDER — ORAL CARE MOUTH RINSE: 15.0000 mL | Freq: Once | OROMUCOSAL | Status: AC

## 2024-09-18 MED ORDER — PROPOFOL 10 MG/ML IV BOLUS
INTRAVENOUS | Status: DC | PRN
  Administered 2024-09-18: 150 mg via INTRAVENOUS

## 2024-09-18 NOTE — Anesthesia Postprocedure Evaluation (Signed)
"   Anesthesia Post Note  Patient: Tracy Mason  Procedure(s) Performed: CYSTOSCOPY/URETEROSCOPY/HOLMIUM LASER/STENT PLACEMENT (Bilateral)     Patient location during evaluation: PACU Anesthesia Type: General Level of consciousness: awake and alert Pain management: pain level controlled Vital Signs Assessment: post-procedure vital signs reviewed and stable Respiratory status: spontaneous breathing, nonlabored ventilation and respiratory function stable Cardiovascular status: blood pressure returned to baseline Postop Assessment: no apparent nausea or vomiting Anesthetic complications: no   No notable events documented.  Last Vitals:  Vitals:   09/18/24 0930 09/18/24 0937  BP: 134/81 138/85  Pulse: 88 85  Resp: 17 10  Temp:  36.9 C  SpO2: 91% 94%    Last Pain:  Vitals:   09/18/24 0937  TempSrc:   PainSc: 0-No pain                 Vertell Row      "

## 2024-09-18 NOTE — OR Nursing (Signed)
 Stones sent with patient per order Dr. Devere.

## 2024-09-18 NOTE — Anesthesia Procedure Notes (Signed)
 Procedure Name: Intubation Date/Time: 09/18/2024 7:40 AM  Performed by: Dartha Meckel, CRNAPre-anesthesia Checklist: Patient identified, Emergency Drugs available, Suction available and Patient being monitored Patient Re-evaluated:Patient Re-evaluated prior to induction Oxygen Delivery Method: Circle system utilized Preoxygenation: Pre-oxygenation with 100% oxygen Induction Type: IV induction Ventilation: Mask ventilation without difficulty LMA: LMA inserted LMA Size: 4.0 Tube type: Oral Number of attempts: 1 Airway Equipment and Method: Stylet and Oral airway Placement Confirmation: ETT inserted through vocal cords under direct vision, positive ETCO2 and breath sounds checked- equal and bilateral Tube secured with: Tape Dental Injury: Teeth and Oropharynx as per pre-operative assessment

## 2024-09-18 NOTE — Transfer of Care (Signed)
 Immediate Anesthesia Transfer of Care Note  Patient: Tracy Mason  Procedure(s) Performed: CYSTOSCOPY/URETEROSCOPY/HOLMIUM LASER/STENT PLACEMENT (Bilateral)  Patient Location: PACU  Anesthesia Type:General  Level of Consciousness: awake and alert   Airway & Oxygen Therapy: Patient Spontanous Breathing and Patient connected to face mask oxygen  Post-op Assessment: Report given to RN and Post -op Vital signs reviewed and stable  Post vital signs: Reviewed and stable  Last Vitals:  Vitals Value Taken Time  BP 138/86 09/18/24 09:04  Temp    Pulse 94 09/18/24 09:06  Resp 18 09/18/24 09:06  SpO2 94 % 09/18/24 09:06  Vitals shown include unfiled device data.  Last Pain:  Vitals:   09/18/24 0557  TempSrc: Oral  PainSc: 0-No pain         Complications: No notable events documented.

## 2024-09-18 NOTE — Discharge Instructions (Signed)
 SABRA

## 2024-09-19 ENCOUNTER — Encounter (HOSPITAL_COMMUNITY): Payer: Self-pay | Admitting: Urology
